# Patient Record
Sex: Male | Born: 1985 | Race: Black or African American | Hispanic: No | Marital: Single | Smoking: Never smoker
Health system: Southern US, Community
[De-identification: ages and names within clinical notes are randomized; demographics above are authoritative.]

## PROBLEM LIST (undated history)

## (undated) DIAGNOSIS — I10 Essential (primary) hypertension: Secondary | ICD-10-CM

## (undated) DIAGNOSIS — J45909 Unspecified asthma, uncomplicated: Secondary | ICD-10-CM

## (undated) DIAGNOSIS — G4733 Obstructive sleep apnea (adult) (pediatric): Secondary | ICD-10-CM

## (undated) DIAGNOSIS — Z9289 Personal history of other medical treatment: Secondary | ICD-10-CM

## (undated) DIAGNOSIS — Z9109 Other allergy status, other than to drugs and biological substances: Secondary | ICD-10-CM

## (undated) HISTORY — DX: Personal history of other medical treatment: Z92.89

## (undated) HISTORY — DX: Obstructive sleep apnea (adult) (pediatric): G47.33

## (undated) HISTORY — DX: Other allergy status, other than to drugs and biological substances: Z91.09

---
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
## 2006-06-28 ENCOUNTER — Emergency Department (HOSPITAL_COMMUNITY): Admission: EM | Admit: 2006-06-28 | Discharge: 2006-06-28 | Payer: Self-pay | Admitting: Emergency Medicine

## 2006-07-02 ENCOUNTER — Emergency Department (HOSPITAL_COMMUNITY): Admission: EM | Admit: 2006-07-02 | Discharge: 2006-07-02 | Payer: Self-pay | Admitting: Family Medicine

## 2006-07-03 ENCOUNTER — Emergency Department (HOSPITAL_COMMUNITY): Admission: EM | Admit: 2006-07-03 | Discharge: 2006-07-03 | Payer: Self-pay | Admitting: Emergency Medicine

## 2006-09-26 ENCOUNTER — Emergency Department (HOSPITAL_COMMUNITY): Admission: EM | Admit: 2006-09-26 | Discharge: 2006-09-26 | Payer: Self-pay | Admitting: Emergency Medicine

## 2006-10-10 ENCOUNTER — Emergency Department (HOSPITAL_COMMUNITY): Admission: EM | Admit: 2006-10-10 | Discharge: 2006-10-11 | Payer: Self-pay | Admitting: Emergency Medicine

## 2007-01-08 ENCOUNTER — Emergency Department (HOSPITAL_COMMUNITY): Admission: EM | Admit: 2007-01-08 | Discharge: 2007-01-08 | Payer: Self-pay | Admitting: Emergency Medicine

## 2007-07-19 ENCOUNTER — Emergency Department (HOSPITAL_COMMUNITY): Admission: EM | Admit: 2007-07-19 | Discharge: 2007-07-20 | Payer: Self-pay | Admitting: Emergency Medicine

## 2008-02-11 ENCOUNTER — Emergency Department (HOSPITAL_COMMUNITY): Admission: EM | Admit: 2008-02-11 | Discharge: 2008-02-11 | Payer: Self-pay | Admitting: Emergency Medicine

## 2008-02-28 ENCOUNTER — Emergency Department (HOSPITAL_COMMUNITY): Admission: EM | Admit: 2008-02-28 | Discharge: 2008-02-28 | Payer: Self-pay | Admitting: Emergency Medicine

## 2008-04-16 ENCOUNTER — Emergency Department (HOSPITAL_COMMUNITY): Admission: EM | Admit: 2008-04-16 | Discharge: 2008-04-16 | Payer: Self-pay | Admitting: Emergency Medicine

## 2008-07-10 ENCOUNTER — Emergency Department (HOSPITAL_COMMUNITY): Admission: EM | Admit: 2008-07-10 | Discharge: 2008-07-10 | Payer: Self-pay | Admitting: Emergency Medicine

## 2008-07-25 ENCOUNTER — Emergency Department (HOSPITAL_COMMUNITY): Admission: EM | Admit: 2008-07-25 | Discharge: 2008-07-25 | Payer: Self-pay | Admitting: Emergency Medicine

## 2008-07-27 ENCOUNTER — Emergency Department (HOSPITAL_COMMUNITY): Admission: EM | Admit: 2008-07-27 | Discharge: 2008-07-27 | Payer: Self-pay | Admitting: Emergency Medicine

## 2008-10-03 ENCOUNTER — Emergency Department (HOSPITAL_COMMUNITY): Admission: EM | Admit: 2008-10-03 | Discharge: 2008-10-04 | Payer: Self-pay | Admitting: Emergency Medicine

## 2008-12-11 ENCOUNTER — Emergency Department (HOSPITAL_COMMUNITY): Admission: EM | Admit: 2008-12-11 | Discharge: 2008-12-11 | Payer: Self-pay | Admitting: Emergency Medicine

## 2009-01-08 ENCOUNTER — Emergency Department (HOSPITAL_COMMUNITY): Admission: EM | Admit: 2009-01-08 | Discharge: 2009-01-08 | Payer: Self-pay | Admitting: Emergency Medicine

## 2009-02-03 ENCOUNTER — Emergency Department (HOSPITAL_COMMUNITY): Admission: EM | Admit: 2009-02-03 | Discharge: 2009-02-03 | Payer: Self-pay | Admitting: Emergency Medicine

## 2009-04-04 ENCOUNTER — Emergency Department (HOSPITAL_COMMUNITY): Admission: EM | Admit: 2009-04-04 | Discharge: 2009-04-04 | Payer: Self-pay | Admitting: Emergency Medicine

## 2009-05-10 ENCOUNTER — Emergency Department (HOSPITAL_COMMUNITY): Admission: EM | Admit: 2009-05-10 | Discharge: 2009-05-10 | Payer: Self-pay | Admitting: Emergency Medicine

## 2011-02-07 NOTE — Op Note (Signed)
NAME:  Christopher Luna, Christopher Luna NO.:  [id number]   MEDICAL RECORD NO.:  [id number]                   PATIENT TYPE:  AMB   LOCATION:  DSC                                  FACILITY:  [facility]   PHYSICIAN:  Lubertha Basque. Jerl Santos, M.D.             DATE OF BIRTH:  04/28/1986   DATE OF PROCEDURE:  11/28/2003  DATE OF DISCHARGE:                                 OPERATIVE REPORT   PREOPERATIVE DIAGNOSIS:  Right knee anterior cruciate ligament tear.   POSTOPERATIVE DIAGNOSES:  1. Right knee anterior cruciate ligament tear.  2. Right knee chondromalacia of the patella.   PROCEDURES:  1. Right knee anterior cruciate ligament reconstruction.  2. Right knee chondroplasty of the patella.   ANESTHESIA:  General.   ATTENDING SURGEON:  Lubertha Basque. Jerl Santos, M.D.   ASSISTANT:  Lindwood Qua, P.A.   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  Unfortunately, he never really regained his extension.  I performed an  arthroscopy on him about two months ago and removed his initial ACL graft  along with a displaced medial meniscus tear.  He has gone through therapy  and has regained his extension.  He is now scheduled for a repeat ACL  reconstruction.  Informed operative consent was obtained after discussion of  the possible complications of, reaction to anesthesia, infection, and  obviously knee stiffness.  The requirement for some significant  postoperative therapy was reviewed with the patient.  We went into a lengthy  discussion about choice of graft material and together decided upon an  allograft.   DESCRIPTION OF PROCEDURE:  The patient was taken to the operating suite,  where a general anesthetic was applied without difficulty.  He was  positioned supine and prepped and draped in normal sterile fashion.  After  the administration of preoperative IV antibiotics, an arthroscopy of  the  right knee was done through two of his old inferior portals.  The  suprapatellar pouch was benign while the patellofemoral joint exhibited some  grade 2 chondromalacia, addressed with a chondroplasty.  The medial  compartment showed him to be without a portion of his medial meniscus, which  we removed at the last surgery, but no new tears were identified.  There  were no degenerative changes.  In the lateral compartment he had benign-  appearing meniscus and articular cartilage.  His PCL was intact.  The ACL  was no longer present.  A notchplasty was done with a bur creating some  space for a new ACL.  The posterior aspect of the notch was well-visualized.  On a back table an allograft was fully defrosted and prepared by Lindwood Qua, P.A.  This was trimmed to fit through 9 and 10 mm tunnels.  Drill  holes were placed in each of the bone  plugs of this graft with patellar  tendon in between.  We passed a PDS suture through one of the bone plugs and  a wire through the other.  A guide was placed into the knee just anterior to  the PCL and was utilized to pass a guidewire up through an anterior stab  wound in the tibia into the knee.  This was over-reamed to a diameter of 10  mm.  A second guide was placed through this tunnel in the over-the-top  position of the femoral notch.  A second guidewire was placed through this  guide and out the distal femur and out the proximal thigh.  This was  utilized to over-ream the distal femur to a diameter of 9 mm and a depth of  about 3 cm.  A 1 or 2 mm posterior wall was well-visualized.  A shaver was  placed in the knee and was utilized to remove bony and cartilaginous debris.  The aforementioned fully-defrosted tension graft was then pulled through the  tibial tunnel and into the femoral tunnel.  Care was taken to keep the  tendinous surface in a posterior direction as it entered the femoral tunnel.  A guidewire was placed through the medial  arthroscopic portal in an anterior  position in the femoral tunnel.  An 8 x 25 mm Linvatec screw was then placed  over this guidewire securing the leading bone plug in the femoral tunnel.  The knee was then ranged fully to full hyperextension and the graft was felt  to be fairly isometric with no impingement in the notch.  A second guidewire  was placed into the knee and was seen to enter the knee through the tibial  tunnel arthroscopically.  We placed an identical interference screw of this  guidewire to secure the trailing bone plug in the tibial tunnel.  Again the  knee ranged fully with no impingement.  The knee was thoroughly irrigated,  followed by the placement of Marcaine with epinephrine and morphine.  Simple  sutures of nylon were used to loosely reapproximate the anterior stab wound.  Adaptic was placed over the wounds, followed by dry gauze and a loose Ace  wrap.  Estimated blood loss and intraoperative fluids can be obtained from  anesthesia records.  No tourniquet was placed.   DISPOSITION:  The patient was extubated in the operating room and taken to  the recovery room in stable condition.  Plans were for him to be admitted  for overnight observation for pain control with probable discharge home in  the morning.                                               Lubertha Basque Jerl Santos, M.D.    PGD/MEDQ  D:  11/28/2003  T:  11/28/2003  Job:  [id number]

## 2011-06-18 LAB — DIFFERENTIAL
Eosinophils Absolute: 0.5
Eosinophils Relative: 9 — ABNORMAL HIGH
Lymphocytes Relative: 28
Lymphs Abs: 1.6
Monocytes Absolute: 0.8

## 2011-06-18 LAB — POCT I-STAT, CHEM 8
BUN: 9
Calcium, Ion: 1.15
Creatinine, Ser: 1.4
Glucose, Bld: 88
Hemoglobin: 16
TCO2: 28

## 2011-06-18 LAB — CBC
HCT: 44.2
Hemoglobin: 15.6
MCV: 86.3
Platelets: 216
WBC: 5.9

## 2015-12-12 ENCOUNTER — Encounter (HOSPITAL_COMMUNITY): Payer: Self-pay | Admitting: *Deleted

## 2015-12-12 DIAGNOSIS — R531 Weakness: Secondary | ICD-10-CM | POA: Diagnosis not present

## 2015-12-12 DIAGNOSIS — J45909 Unspecified asthma, uncomplicated: Secondary | ICD-10-CM | POA: Insufficient documentation

## 2015-12-12 DIAGNOSIS — A084 Viral intestinal infection, unspecified: Secondary | ICD-10-CM | POA: Insufficient documentation

## 2015-12-12 DIAGNOSIS — R1110 Vomiting, unspecified: Secondary | ICD-10-CM | POA: Diagnosis present

## 2015-12-12 LAB — CBC
HEMATOCRIT: 44.9 % (ref 39.0–52.0)
HEMOGLOBIN: 15.3 g/dL (ref 13.0–17.0)
MCH: 28.6 pg (ref 26.0–34.0)
MCHC: 34.1 g/dL (ref 30.0–36.0)
MCV: 83.9 fL (ref 78.0–100.0)
Platelets: 229 10*3/uL (ref 150–400)
RBC: 5.35 MIL/uL (ref 4.22–5.81)
RDW: 12.9 % (ref 11.5–15.5)
WBC: 7.2 10*3/uL (ref 4.0–10.5)

## 2015-12-12 LAB — COMPREHENSIVE METABOLIC PANEL
ALBUMIN: 3.9 g/dL (ref 3.5–5.0)
ALK PHOS: 50 U/L (ref 38–126)
ALT: 32 U/L (ref 17–63)
AST: 33 U/L (ref 15–41)
Anion gap: 10 (ref 5–15)
BILIRUBIN TOTAL: 2 mg/dL — AB (ref 0.3–1.2)
BUN: 10 mg/dL (ref 6–20)
CO2: 25 mmol/L (ref 22–32)
CREATININE: 1.32 mg/dL — AB (ref 0.61–1.24)
Calcium: 8.9 mg/dL (ref 8.9–10.3)
Chloride: 102 mmol/L (ref 101–111)
GFR calc Af Amer: >60 mL/min (ref >60)
GFR calc non Af Amer: >60 mL/min (ref >60)
GLUCOSE: 103 mg/dL — AB (ref 65–99)
Potassium: 3.6 mmol/L (ref 3.5–5.1)
SODIUM: 137 mmol/L (ref 135–145)
Total Protein: 7.3 g/dL (ref 6.5–8.1)

## 2015-12-12 LAB — URINALYSIS, ROUTINE W REFLEX MICROSCOPIC
Bilirubin Urine: NEGATIVE
GLUCOSE, UA: NEGATIVE mg/dL
Ketones, ur: 15 mg/dL — AB
LEUKOCYTES UA: NEGATIVE
Nitrite: NEGATIVE
Protein, ur: NEGATIVE mg/dL
SPECIFIC GRAVITY, URINE: 1.029 (ref 1.005–1.030)
pH: 6 (ref 5.0–8.0)

## 2015-12-12 LAB — URINE MICROSCOPIC-ADD ON

## 2015-12-12 LAB — LIPASE, BLOOD: Lipase: 21 U/L (ref 11–51)

## 2015-12-12 NOTE — ED Notes (Signed)
Pt c/o emesis since yesterday, once today. States he feels weak.

## 2015-12-13 ENCOUNTER — Emergency Department (HOSPITAL_COMMUNITY)
Admission: EM | Admit: 2015-12-13 | Discharge: 2015-12-13 | Disposition: A | Discharge status: Home / Self Care | Payer: Medicaid Other | Attending: Emergency Medicine | Admitting: Emergency Medicine

## 2015-12-13 DIAGNOSIS — A084 Viral intestinal infection, unspecified: Secondary | ICD-10-CM

## 2015-12-13 HISTORY — DX: Unspecified asthma, uncomplicated: J45.909

## 2015-12-13 MED ORDER — SODIUM CHLORIDE 0.9 % IV BOLUS (SEPSIS)
500.0000 mL | Freq: Once | INTRAVENOUS | Status: AC
  Administered 2015-12-13: 500 mL via INTRAVENOUS

## 2015-12-13 MED ORDER — KETOROLAC TROMETHAMINE 30 MG/ML IJ SOLN
30.0000 mg | Freq: Once | INTRAMUSCULAR | Status: AC
  Administered 2015-12-13: 30 mg via INTRAVENOUS
  Filled 2015-12-13: qty 1

## 2015-12-13 MED ORDER — DICYCLOMINE HCL 10 MG/ML IM SOLN
20.0000 mg | Freq: Once | INTRAMUSCULAR | Status: AC
  Administered 2015-12-13: 20 mg via INTRAMUSCULAR
  Filled 2015-12-13: qty 2

## 2015-12-13 MED ORDER — ONDANSETRON HCL 4 MG/2ML IJ SOLN
4.0000 mg | Freq: Once | INTRAMUSCULAR | Status: AC
  Administered 2015-12-13: 4 mg via INTRAVENOUS
  Filled 2015-12-13: qty 2

## 2015-12-13 MED ORDER — PROMETHAZINE HCL 25 MG PO TABS: 25.0000 mg | ORAL_TABLET | Freq: Four times a day (QID) | ORAL | Status: DC | PRN

## 2015-12-13 MED ORDER — SODIUM CHLORIDE 0.9 % IV BOLUS (SEPSIS)
1000.0000 mL | Freq: Once | INTRAVENOUS | Status: AC
  Administered 2015-12-13: 1000 mL via INTRAVENOUS

## 2015-12-13 MED ORDER — DICYCLOMINE HCL 20 MG PO TABS: 20.0000 mg | ORAL_TABLET | Freq: Two times a day (BID) | ORAL | Status: DC

## 2015-12-13 NOTE — ED Provider Notes (Signed)
CSN: [id number]     Arrival date & time 12/12/15  2125 History   First MD Initiated Contact with Patient 12/13/15 0202     Chief Complaint  Patient presents with  . Emesis  . Weakness     (Consider location/radiation/quality/duration/timing/severity/associated sxs/prior Treatment) HPI Comments: 30 year old male with a history of asthma presents to the emergency department for evaluation of vomiting which began yesterday. Patient reports persistent emesis over the past 48 hours. Emesis is intermittent, last episode was while patient was waiting in the waiting room. Symptoms associated with watery diarrhea as well. Patient states that he has been feeling weak and fatigued. He reports associated sick contacts, stating that his brother has been sick with vomiting and his daughter is also ill. No medications taken prior to arrival for symptoms. Patient denies fever, abdominal pain, urinary symptoms, and a history of abdominal surgeries.  Patient is a 30 y.o. male presenting with vomiting and weakness. The history is provided by the patient. No language interpreter was used.  Emesis Associated symptoms: diarrhea   Associated symptoms: no abdominal pain   Weakness Associated symptoms include vomiting and weakness. Pertinent negatives include no abdominal pain or fever.    Past Medical History  Diagnosis Date  . Asthma    Past Surgical History  Procedure Laterality Date  . Arthroscopic repair acl     No family history on file. Social History  Substance Use Topics  . Smoking status: Never Smoker   . Smokeless tobacco: None  . Alcohol Use: No    Review of Systems  Constitutional: Negative for fever.  Respiratory: Negative for shortness of breath.   Gastrointestinal: Positive for vomiting and diarrhea. Negative for abdominal pain.  Neurological: Positive for weakness.  All other systems reviewed and are negative.   Allergies  Review of patient's allergies indicates no known  allergies.  Home Medications   Prior to Admission medications   Medication Sig Start Date End Date Taking? Authorizing Provider  dicyclomine (BENTYL) 20 MG tablet Take 1 tablet (20 mg total) by mouth 2 (two) times daily. 12/13/15   Antony Madura, PA-C  promethazine (PHENERGAN) 25 MG tablet Take 1 tablet (25 mg total) by mouth every 6 (six) hours as needed for nausea or vomiting. 12/13/15   Antony Madura, PA-C   BP 103/54 mmHg  Pulse 93  Temp(Src) 98.8 F (37.1 C) (Oral)  Resp 18  SpO2 94%   Physical Exam  Constitutional: He is oriented to person, place, and time. He appears well-developed and well-nourished. No distress.  Nontoxic appearing  HENT:  Head: Normocephalic and atraumatic.  Dry mm  Eyes: Conjunctivae and EOM are normal. No scleral icterus.  Neck: Normal range of motion.  Cardiovascular: Normal rate, regular rhythm and intact distal pulses.   Pulmonary/Chest: Effort normal and breath sounds normal. No respiratory distress. He has no wheezes. He has no rales.  Respirations even and unlabored  Abdominal: Soft. He exhibits no distension. There is tenderness. There is no rebound and no guarding.  Mild focal LLQ pain. No masses or peritoneal signs. No rigidity or guarding.  Musculoskeletal: Normal range of motion.  Neurological: He is alert and oriented to person, place, and time. He exhibits normal muscle tone. Coordination normal.  Patient moving all extremities  Skin: Skin is warm and dry. No rash noted. He is not diaphoretic. No erythema. No pallor.  Psychiatric: He has a normal mood and affect. His behavior is normal.  Nursing note and vitals reviewed.   ED Course  Procedures (including critical care time) Labs Review Labs Reviewed  COMPREHENSIVE METABOLIC PANEL - Abnormal; Notable for the following:    Glucose, Bld 103 (*)    Creatinine, Ser 1.32 (*)    Total Bilirubin 2.0 (*)    All other components within normal limits  URINALYSIS, ROUTINE W REFLEX MICROSCOPIC  (NOT AT [facility]) - Abnormal; Notable for the following:    Color, Urine AMBER (*)    Hgb urine dipstick TRACE (*)    Ketones, ur 15 (*)    All other components within normal limits  URINE MICROSCOPIC-ADD ON - Abnormal; Notable for the following:    Squamous Epithelial / LPF 0-5 (*)    Bacteria, UA RARE (*)    All other components within normal limits  LIPASE, BLOOD  CBC    Imaging Review No results found.   I have personally reviewed and evaluated these images and lab results as part of my medical decision-making.   EKG Interpretation None      3:50 AM Patient reexamined. No abdominal TTP noted. Patient sleeping, in no distress. He continues to quickly take in IVF. Plan d/c after additional 500cc bolus.  4:55 AM Patient finished 1.5L IVF. He is tolerating Sprite without emesis. Vital stable. Will discharge with supportive measures. MDM   Final diagnoses:  Viral gastroenteritis    Patient with symptoms consistent with viral gastroenteritis. Vitals are stable, no fever. Lungs are clear. Reassuring abdominal exam today; stable on reexamination. No leukocytosis. Doubt appendicitis, cholecystitis, pancreatitis, ruptured viscus, UTI, kidney stone, or other emergent abdominal etiology. No clinical signs of dehydration. Patient tolerating PO fluids in the ED. Supportive therapy indicated with return if symptoms worsen. Return precautions discussed and provided. Patient discharged in satisfactory condition with no unaddressed concerns.   Filed Vitals:   12/12/15 2200 12/13/15 0058 12/13/15 0300  BP: 159/88 127/73 103/54  Pulse: 104 90 93  Temp: 98.6 F (37 C) 98.8 F (37.1 C)   TempSrc: Oral    Resp: 18 18 18   SpO2: 98% 97% 94%     Antony MaduraKelly Octavious Zidek, PA-C 12/13/15 0500  Marily MemosJason Mesner, MD 12/13/15 [phone]

## 2015-12-13 NOTE — Discharge Instructions (Signed)

## 2015-12-13 NOTE — ED Notes (Signed)
Pt given sprite for PO challenge

## 2015-12-15 ENCOUNTER — Encounter (HOSPITAL_COMMUNITY): Payer: Self-pay | Admitting: Nurse Practitioner

## 2015-12-15 ENCOUNTER — Emergency Department (HOSPITAL_COMMUNITY)
Admission: EM | Admit: 2015-12-15 | Discharge: 2015-12-15 | Disposition: A | Discharge status: Home / Self Care | Payer: Medicaid Other | Attending: Emergency Medicine | Admitting: Emergency Medicine

## 2015-12-15 ENCOUNTER — Emergency Department (HOSPITAL_COMMUNITY): Payer: Medicaid Other

## 2015-12-15 DIAGNOSIS — J45909 Unspecified asthma, uncomplicated: Secondary | ICD-10-CM | POA: Insufficient documentation

## 2015-12-15 DIAGNOSIS — R109 Unspecified abdominal pain: Secondary | ICD-10-CM

## 2015-12-15 DIAGNOSIS — M549 Dorsalgia, unspecified: Secondary | ICD-10-CM | POA: Diagnosis not present

## 2015-12-15 DIAGNOSIS — Z79899 Other long term (current) drug therapy: Secondary | ICD-10-CM | POA: Diagnosis not present

## 2015-12-15 DIAGNOSIS — Z8619 Personal history of other infectious and parasitic diseases: Secondary | ICD-10-CM | POA: Insufficient documentation

## 2015-12-15 LAB — CBC
HEMATOCRIT: 44.7 % (ref 39.0–52.0)
HEMOGLOBIN: 15.4 g/dL (ref 13.0–17.0)
MCH: 28.6 pg (ref 26.0–34.0)
MCHC: 34.5 g/dL (ref 30.0–36.0)
MCV: 83.1 fL (ref 78.0–100.0)
Platelets: 255 10*3/uL (ref 150–400)
RBC: 5.38 MIL/uL (ref 4.22–5.81)
RDW: 12.6 % (ref 11.5–15.5)
WBC: 6.8 10*3/uL (ref 4.0–10.5)

## 2015-12-15 LAB — COMPREHENSIVE METABOLIC PANEL
ALK PHOS: 53 U/L (ref 38–126)
ALT: 31 U/L (ref 17–63)
AST: 29 U/L (ref 15–41)
Albumin: 4.1 g/dL (ref 3.5–5.0)
Anion gap: 8 (ref 5–15)
BILIRUBIN TOTAL: 1.3 mg/dL — AB (ref 0.3–1.2)
BUN: 11 mg/dL (ref 6–20)
CHLORIDE: 103 mmol/L (ref 101–111)
CO2: 31 mmol/L (ref 22–32)
Calcium: 9.2 mg/dL (ref 8.9–10.3)
Creatinine, Ser: 1.14 mg/dL (ref 0.61–1.24)
GFR calc Af Amer: >60 mL/min (ref >60)
GFR calc non Af Amer: >60 mL/min (ref >60)
GLUCOSE: 77 mg/dL (ref 65–99)
POTASSIUM: 3.9 mmol/L (ref 3.5–5.1)
SODIUM: 142 mmol/L (ref 135–145)
TOTAL PROTEIN: 7.1 g/dL (ref 6.5–8.1)

## 2015-12-15 LAB — URINALYSIS, ROUTINE W REFLEX MICROSCOPIC
Bilirubin Urine: NEGATIVE
GLUCOSE, UA: NEGATIVE mg/dL
Hgb urine dipstick: NEGATIVE
Ketones, ur: NEGATIVE mg/dL
LEUKOCYTES UA: NEGATIVE
NITRITE: NEGATIVE
PH: 6 (ref 5.0–8.0)
Protein, ur: 30 mg/dL — AB
SPECIFIC GRAVITY, URINE: 1.024 (ref 1.005–1.030)

## 2015-12-15 LAB — URINE MICROSCOPIC-ADD ON
Bacteria, UA: NONE SEEN
RBC / HPF: NONE SEEN RBC/hpf (ref 0–5)
WBC UA: NONE SEEN WBC/hpf (ref 0–5)

## 2015-12-15 NOTE — ED Notes (Signed)
He c/o 1 week history  L lower back/flank pain increasingly worse since onset. He repots some episodes n/v. He was here 2 days ago and diagnosed with viral gastroenteritis and he initially felt better but pain continues. He denies bowel/bladder chagnes.

## 2015-12-15 NOTE — ED Notes (Signed)
Patient transported to Ultrasound 

## 2015-12-15 NOTE — Discharge Instructions (Signed)
There does not appear to be an emergent cause for your symptoms at this time. Your exam was reassuring, as were your labs. Your ultrasound showed no evidence of stone or other things concerning for infection. Continue taking Tylenol and Motrin for your discomfort. Follow-up with your doctor next week for reevaluation. Return to ED for any new or worsening symptoms as we discussed

## 2015-12-15 NOTE — ED Provider Notes (Signed)
CSN: [id number]     Arrival date & time 12/15/15  1217 History   First MD Initiated Contact with Patient 12/15/15 1350     Chief Complaint  Patient presents with  . Flank Pain     (Consider location/radiation/quality/duration/timing/severity/associated sxs/prior Treatment) HPI Christopher Luna is a 30 y.o. male with a history of well-controlled asthma, comes in for evaluation of left-sided flank and back pain. Patient reports symptoms have been ongoing for the past 2 weeks. He reports originally the pain was intermittent and mild, but over the past 1 week it has become constant, sharp and worse with movement. He reports associated nausea and vomiting times one episode, 2 days ago that has since resolved. Denies any fevers, chills, abdominal pain, urinary symptoms, diarrhea or constipation, rash, sick contacts, cough, shortness of breath, chest pain, numbness or weakness. Reports he was seen in the ED 2 days ago and diagnosed with a viral gastroenteritis and initially felt better after fluids, but symptoms have started to worsen.  Past Medical History  Diagnosis Date  . Asthma    Past Surgical History  Procedure Laterality Date  . Arthroscopic repair acl     History reviewed. No pertinent family history. Social History  Substance Use Topics  . Smoking status: Never Smoker   . Smokeless tobacco: None  . Alcohol Use: No    Review of Systems A 10 point review of systems was completed and was negative except for pertinent positives and negatives as mentioned in the history of present illness     Allergies  Review of patient's allergies indicates no known allergies.  Home Medications   Prior to Admission medications   Medication Sig Start Date End Date Taking? Authorizing Provider  dicyclomine (BENTYL) 20 MG tablet Take 1 tablet (20 mg total) by mouth 2 (two) times daily. 12/13/15   Antony Madura, PA-C  promethazine (PHENERGAN) 25 MG tablet Take 1 tablet (25 mg total) by mouth every 6  (six) hours as needed for nausea or vomiting. 12/13/15   Antony Madura, PA-C   BP 141/89 mmHg  Pulse 74  Temp(Src) 97.8 F (36.6 C) (Oral)  Resp 16  Ht  (1.93 m)  Wt 156.037 kg  BMI 41.89 kg/m2  SpO2 100% Physical Exam  Constitutional: He is oriented to person, place, and time. He appears well-developed and well-nourished.  Overall well-appearing African American male  HENT:  Head: Normocephalic and atraumatic.  Mouth/Throat: Oropharynx is clear and moist.  Eyes: Conjunctivae are normal. Pupils are equal, round, and reactive to light. Right eye exhibits no discharge. Left eye exhibits no discharge. No scleral icterus.  Neck: Neck supple.  Cardiovascular: Normal rate, regular rhythm and normal heart sounds.   Pulmonary/Chest: Effort normal and breath sounds normal. No respiratory distress. He has no wheezes. He has no rales.  Abdominal: Soft. He exhibits no distension and no mass. There is no tenderness. There is no rebound and no guarding.  Musculoskeletal:  Discomfort is replicated with palpation of the left lateral ribs and intercostal spaces along axillary and posterior axillary line. No rash noted. No crepitus, tenting or other abnormalities. Full active range of motion of all extremities. No other abnormal findings  Neurological: He is alert and oriented to person, place, and time.  Cranial Nerves II-XII grossly intact  Skin: Skin is warm and dry. No rash noted.  Psychiatric: He has a normal mood and affect.  Nursing note and vitals reviewed.   ED Course  Procedures (including critical care time) Labs  Review Labs Reviewed  COMPREHENSIVE METABOLIC PANEL - Abnormal; Notable for the following:    Total Bilirubin 1.3 (*)    All other components within normal limits  URINALYSIS, ROUTINE W REFLEX MICROSCOPIC (NOT AT [facility]) - Abnormal; Notable for the following:    APPearance HAZY (*)    Protein, ur 30 (*)    All other components within normal limits  URINE MICROSCOPIC-ADD ON  - Abnormal; Notable for the following:    Squamous Epithelial / LPF 0-5 (*)    All other components within normal limits  CBC    Imaging Review [place] Renal  12/15/2015  CLINICAL DATA:  Left flank pain for 2 weeks. EXAM: RENAL / URINARY TRACT ULTRASOUND COMPLETE COMPARISON:  None. FINDINGS: Right Kidney: Length: 11.3 cm. Echogenicity within normal limits. No mass or hydronephrosis visualized. Left Kidney: Length: 12.0 cm. Echogenicity within normal limits. No mass or hydronephrosis visualized. Bladder: Appears normal for degree of bladder distention. IMPRESSION: Normal renal ultrasound. Electronically Signed   By: Amie Portlandavid  Ormond M.D.   On: 12/15/2015 15:13   I have personally reviewed and evaluated these images and lab results as part of my medical decision-making.   EKG Interpretation None     Meds given in ED:  Medications - No data to display  Discharge Medication List as of 12/15/2015  3:22 PM     Filed Vitals:   12/15/15 1259 12/15/15 1526  BP: 143/90 141/89  Pulse: 90 74  Temp: 97.6 F (36.4 C) 97.8 F (36.6 C)  TempSrc: Oral Oral  Resp: 20 16  Height: 6\' 4"  (1.93 m)   Weight: 156.037 kg   SpO2: 98% 100%    MDM  Christopher Luna is a 30 y.o. male who presents for evaluation of left flank pain over the past 2 weeks. She'll evaluated in the ED 2 days ago, diagnosed with viral gastroenteritis with overall unremarkable workup. Patient reports pain is persistent and constant now. Discomfort is replicated with palpation and certain body movements. However, given chronicity of discomfort, trace hemoglobin on previous urinalysis, will obtain repeat labs as well as a renal ultrasound to evaluate for possible stone. On reevaluation, the patient maintains benign physical exam. Labs are unremarkable, no hematuria or other evidence of infection. Ultrasound unremarkable. At this time I have low suspicion for any acute or emergent pathology. Encourage continued symptomatic support at home and  follow up with PCP next week for reevaluation. States he feels much better. Discussed strict return precautions. He verbalizes understanding and agrees with this plan as well as subsequent discharge. Final diagnoses:  Left flank pain        Joycie PeekBenjamin Geza Beranek, PA-C 12/15/15 1626  Azalia BilisKevin Campos, MD 12/15/15 1721

## 2016-01-17 ENCOUNTER — Encounter (HOSPITAL_COMMUNITY): Payer: Self-pay | Admitting: *Deleted

## 2016-01-17 ENCOUNTER — Emergency Department (HOSPITAL_COMMUNITY): Payer: Medicaid Other

## 2016-01-17 DIAGNOSIS — J4521 Mild intermittent asthma with (acute) exacerbation: Secondary | ICD-10-CM | POA: Insufficient documentation

## 2016-01-17 DIAGNOSIS — H1013 Acute atopic conjunctivitis, bilateral: Secondary | ICD-10-CM | POA: Insufficient documentation

## 2016-01-17 DIAGNOSIS — R062 Wheezing: Secondary | ICD-10-CM | POA: Diagnosis present

## 2016-01-17 DIAGNOSIS — Z79899 Other long term (current) drug therapy: Secondary | ICD-10-CM | POA: Insufficient documentation

## 2016-01-17 MED ORDER — IPRATROPIUM-ALBUTEROL 0.5-2.5 (3) MG/3ML IN SOLN
3.0000 mL | Freq: Once | RESPIRATORY_TRACT | Status: AC
  Administered 2016-01-17: 3 mL via RESPIRATORY_TRACT

## 2016-01-17 MED ORDER — ALBUTEROL SULFATE (2.5 MG/3ML) 0.083% IN NEBU
INHALATION_SOLUTION | RESPIRATORY_TRACT | Status: AC
  Filled 2016-01-17: qty 3

## 2016-01-17 NOTE — ED Notes (Signed)
Asthma exacerbation, increasing over one week.  Some tightness and sob. No fever or chill, pt was using inhaler at home obtaining only minimal relief

## 2016-01-18 ENCOUNTER — Emergency Department (HOSPITAL_COMMUNITY)
Admission: EM | Admit: 2016-01-18 | Discharge: 2016-01-18 | Disposition: A | Discharge status: Home / Self Care | Payer: Medicaid Other | Attending: Emergency Medicine | Admitting: Emergency Medicine

## 2016-01-18 DIAGNOSIS — J4520 Mild intermittent asthma, uncomplicated: Secondary | ICD-10-CM

## 2016-01-18 MED ORDER — IPRATROPIUM BROMIDE HFA 17 MCG/ACT IN AERS: 2.0000 | INHALATION_SPRAY | Freq: Once | RESPIRATORY_TRACT | Status: DC

## 2016-01-18 MED ORDER — IPRATROPIUM BROMIDE 0.02 % IN SOLN
0.5000 mg | Freq: Once | RESPIRATORY_TRACT | Status: AC
  Administered 2016-01-18: 0.5 mg via RESPIRATORY_TRACT
  Filled 2016-01-18: qty 2.5

## 2016-01-18 MED ORDER — PREDNISONE 20 MG PO TABS: 60.0000 mg | ORAL_TABLET | Freq: Every day | ORAL | Status: DC

## 2016-01-18 MED ORDER — PREDNISONE 20 MG PO TABS
60.0000 mg | ORAL_TABLET | Freq: Once | ORAL | Status: AC
  Administered 2016-01-18: 60 mg via ORAL
  Filled 2016-01-18: qty 3

## 2016-01-18 MED ORDER — ALBUTEROL SULFATE (2.5 MG/3ML) 0.083% IN NEBU
5.0000 mg | INHALATION_SOLUTION | Freq: Once | RESPIRATORY_TRACT | Status: AC
  Administered 2016-01-18: 5 mg via RESPIRATORY_TRACT
  Filled 2016-01-18: qty 6

## 2016-01-18 MED ORDER — OLOPATADINE HCL 0.2 % OP SOLN: 1.0000 [drp] | Freq: Every day | OPHTHALMIC | Status: DC

## 2016-01-18 MED ORDER — ALBUTEROL SULFATE HFA 108 (90 BASE) MCG/ACT IN AERS
2.0000 | INHALATION_SPRAY | RESPIRATORY_TRACT | Status: DC | PRN
  Administered 2016-01-18: 2 via RESPIRATORY_TRACT
  Filled 2016-01-18: qty 6.7

## 2016-01-18 NOTE — ED Provider Notes (Signed)
CSN: [id number]     Arrival date & time 01/17/16  2253 History   First MD Initiated Contact with Patient 01/18/16 0448     Chief Complaint  Patient presents with  . Asthma     (Consider location/radiation/quality/duration/timing/severity/associated sxs/prior Treatment) HPI Comments: Patient with a history of asthma presents with increased wheezing over the last week. He reports he has used more than one full inhaler. He does not use nebulized medication at home. No fever. He states he has had remote history of hospital admissions without known history of intubation. Chest tightness without chest pain. No vomiting or diarrhea.   Patient is a 30 y.o. male presenting with asthma. The history is provided by the patient. No language interpreter was used.  Asthma This is a recurrent problem. The current episode started 1 to 4 weeks ago. The problem has been gradually worsening. Pertinent negatives include no chest pain, congestion, fever, myalgias, rash, sore throat or vomiting.    Past Medical History  Diagnosis Date  . Asthma    Past Surgical History  Procedure Laterality Date  . Arthroscopic repair acl     No family history on file. Social History  Substance Use Topics  . Smoking status: Never Smoker   . Smokeless tobacco: None  . Alcohol Use: No    Review of Systems  Constitutional: Negative for fever.  HENT: Negative for congestion and sore throat.   Eyes: Positive for redness.  Respiratory: Positive for shortness of breath and wheezing.   Cardiovascular: Negative for chest pain.  Gastrointestinal: Negative for vomiting.  Musculoskeletal: Negative for myalgias and neck stiffness.  Skin: Negative for rash.      Allergies  Review of patient's allergies indicates no known allergies.  Home Medications   Prior to Admission medications   Medication Sig Start Date End Date Taking? Authorizing Provider  dicyclomine (BENTYL) 20 MG tablet Take 1 tablet (20 mg total) by mouth  2 (two) times daily. 12/13/15   Antony Madura, PA-C  promethazine (PHENERGAN) 25 MG tablet Take 1 tablet (25 mg total) by mouth every 6 (six) hours as needed for nausea or vomiting. 12/13/15   Antony Madura, PA-C   BP 148/82 mmHg  Pulse 80  Temp(Src) 97.7 F (36.5 C) (Oral)  Resp 18  Wt 154.223 kg  SpO2 96% Physical Exam  Constitutional: He is oriented to person, place, and time. He appears well-developed and well-nourished.  HENT:  Head: Normocephalic.  Eyes: Pupils are equal, round, and reactive to light. Right conjunctiva is injected. Right conjunctiva has no hemorrhage. Left conjunctiva is injected. Left conjunctiva has no hemorrhage.  Neck: Normal range of motion. Neck supple.  Cardiovascular: Normal rate and regular rhythm.   Pulmonary/Chest: Effort normal. He has wheezes.  Abdominal: Soft. Bowel sounds are normal. There is no tenderness. There is no rebound and no guarding.  Musculoskeletal: Normal range of motion.  Neurological: He is alert and oriented to person, place, and time.  Skin: Skin is warm and dry. No rash noted.  Psychiatric: He has a normal mood and affect.    ED Course  Procedures (including critical care time) Labs Review Labs Reviewed - No data to display  Imaging Review Dg Chest 2 View  01/18/2016  CLINICAL DATA:  Acute onset of chest tightness and shortness of breath. Initial encounter. EXAM: CHEST  2 VIEW COMPARISON:  Chest radiograph performed 10/03/2008 FINDINGS: The lungs are well-aerated and clear. There is no evidence of focal opacification, pleural effusion or pneumothorax. The heart  is normal in size; the mediastinal contour is within normal limits. No acute osseous abnormalities are seen. IMPRESSION: No acute cardiopulmonary process seen. Electronically Signed   By: Roanna RaiderJeffery  Chang M.D.   On: 01/18/2016 00:03   I have personally reviewed and evaluated these images and lab results as part of my medical decision-making.   EKG Interpretation None       MDM   Final diagnoses:  None    1. Asthma 2. Allergic conjunctivitis  The patient was wheezing on arrival to the ED, improved after duo neb tx. No fever, hypoxia. When seen he felt his chest was tightening again and a second treatment was provided. He is ambulated with pulse ox remaining in the upper 90's. Symptoms felt to represent mild asthmatic exacerbation, improved. He can be discharged home with return precautions.     Elpidio AnisShari Monika Chestang, PA-C 01/18/16 0556  April Palumbo, MD 01/18/16 [phone]

## 2016-01-18 NOTE — ED Notes (Signed)
Ambulated patient in hall.  O2 sats 97 - 100% while ambulating.

## 2016-01-18 NOTE — Discharge Instructions (Signed)
Asthma, Adult Asthma is a recurring condition in which the airways tighten and narrow. Asthma can make it difficult to breathe. It can cause coughing, wheezing, and shortness of breath. Asthma episodes, also called asthma attacks, range from minor to life-threatening. Asthma cannot be cured, but medicines and lifestyle changes can help control it. CAUSES Asthma is believed to be caused by inherited (genetic) and environmental factors, but its exact cause is unknown. Asthma may be triggered by allergens, lung infections, or irritants in the air. Asthma triggers are different for each person. Common triggers include:   Animal dander.  Dust mites.  Cockroaches.  Pollen from trees or grass.  Mold.  Smoke.  Air pollutants such as dust, household cleaners, hair sprays, aerosol sprays, paint fumes, strong chemicals, or strong odors.  Cold air, weather changes, and winds (which increase molds and pollens in the air).  Strong emotional expressions such as crying or laughing hard.  Stress.  Certain medicines (such as aspirin) or types of drugs (such as beta-blockers).  Sulfites in foods and drinks. Foods and drinks that may contain sulfites include dried fruit, potato chips, and sparkling grape juice.  Infections or inflammatory conditions such as the flu, a cold, or an inflammation of the nasal membranes (rhinitis).  Gastroesophageal reflux disease (GERD).  Exercise or strenuous activity. SYMPTOMS Symptoms may occur immediately after asthma is triggered or many hours later. Symptoms include:  Wheezing.  Excessive nighttime or early morning coughing.  Frequent or severe coughing with a common cold.  Chest tightness.  Shortness of breath. DIAGNOSIS  The diagnosis of asthma is made by a review of your medical history and a physical exam. Tests may also be performed. These may include:  Lung function studies. These tests show how much air you breathe in and out.  Allergy  tests.  Imaging tests such as X-rays. TREATMENT  Asthma cannot be cured, but it can usually be controlled. Treatment involves identifying and avoiding your asthma triggers. It also involves medicines. There are 2 classes of medicine used for asthma treatment:   Controller medicines. These prevent asthma symptoms from occurring. They are usually taken every day.  Reliever or rescue medicines. These quickly relieve asthma symptoms. They are used as needed and provide short-term relief. Your health care provider will help you create an asthma action plan. An asthma action plan is a written plan for managing and treating your asthma attacks. It includes a list of your asthma triggers and how they may be avoided. It also includes information on when medicines should be taken and when their dosage should be changed. An action plan may also involve the use of a device called a peak flow meter. A peak flow meter measures how well the lungs are working. It helps you monitor your condition. HOME CARE INSTRUCTIONS   Take medicines only as directed by your health care provider. Speak with your health care provider if you have questions about how or when to take the medicines.  Use a peak flow meter as directed by your health care provider. Record and keep track of readings.  Understand and use the action plan to help minimize or stop an asthma attack without needing to seek medical care.  Control your home environment in the following ways to help prevent asthma attacks:  Do not smoke. Avoid being exposed to secondhand smoke.  Change your heating and air conditioning filter regularly.  Limit your use of fireplaces and wood stoves.  Get rid of pests (such as roaches  and mice) and their droppings.  Throw away plants if you see mold on them.  Clean your floors and dust regularly. Use unscented cleaning products.  Try to have someone else vacuum for you regularly. Stay out of rooms while they are  being vacuumed and for a short while afterward. If you vacuum, use a dust mask from a hardware store, a double-layered or microfilter vacuum cleaner bag, or a vacuum cleaner with a HEPA filter.  Replace carpet with wood, tile, or vinyl flooring. Carpet can trap dander and dust.  Use allergy-proof pillows, mattress covers, and box spring covers.  Wash bed sheets and blankets every week in hot water and dry them in a dryer.  Use blankets that are made of polyester or cotton.  Clean bathrooms and kitchens with bleach. If possible, have someone repaint the walls in these rooms with mold-resistant paint. Keep out of the rooms that are being cleaned and painted.  Wash hands frequently. SEEK MEDICAL CARE IF:   You have wheezing, shortness of breath, or a cough even if taking medicine to prevent attacks.  The colored mucus you cough up (sputum) is thicker than usual.  Your sputum changes from clear or white to yellow, green, gray, or bloody.  You have any problems that may be related to the medicines you are taking (such as a rash, itching, swelling, or trouble breathing).  You are using a reliever medicine more than 2-3 times per week.  Your peak flow is still at 50-79% of your personal best after following your action plan for 1 hour.  You have a fever. SEEK IMMEDIATE MEDICAL CARE IF:   You seem to be getting worse and are unresponsive to treatment during an asthma attack.  You are short of breath even at rest.  You get short of breath when doing very little physical activity.  You have difficulty eating, drinking, or talking due to asthma symptoms.  You develop chest pain.  You develop a fast heartbeat.  You have a bluish color to your lips or fingernails.  You are light-headed, dizzy, or faint.  Your peak flow is less than 50% of your personal best.   This information is not intended to replace advice given to you by your health care provider. Make sure you discuss any  questions you have with your health care provider.   Document Released: 09/08/2005 Document Revised: 05/30/2015 Document Reviewed: 04/07/2013 Elsevier Interactive Patient Education 2016 [employer] Allergic Conjunctivitis Allergic conjunctivitis is inflammation of the clear membrane that covers the white part of your eye and the inner surface of your eyelid (conjunctiva), and it is caused by allergies. The blood vessels in the conjunctiva become inflamed, and this causes the eye to become red or pink, and it often causes itchiness in the eye. Allergic conjunctivitis cannot be spread by one person to another person (noncontagious). CAUSES This condition is caused by an allergic reaction. Common causes of an allergic reaction (allergens) include:  Dust.  Pollen.  Mold.  Animal dander or secretions. RISK FACTORS This condition is more likely to develop if you are exposed to high levels of allergens that cause the allergic reaction. This might include being outdoors when air pollen levels are high or being around animals that you are allergic to. SYMPTOMS Symptoms of this condition may include:  Eye redness.  Tearing of the eyes.  Watery eyes.  Itchy eyes.  Burning feeling in the eyes.  Clear drainage from the eyes.  Swollen eyelids. DIAGNOSIS This  condition may be diagnosed by medical history and physical exam. If you have drainage from your eyes, it may be tested to rule out other causes of conjunctivitis. TREATMENT Treatment for this condition often includes medicines. These may be eye drops, ointments, or oral medicines. They may be prescription medicines or over-the-counter medicines. HOME CARE INSTRUCTIONS  Take or apply medicines only as directed by your health care provider.  Do not touch or rub your eyes.  Do not wear contact lenses until the inflammation is gone. Wear glasses instead.  Do not wear eye makeup until the inflammation is gone.  Apply a cool, clean  washcloth to your eye for 10-20 minutes, 3-4 times a day.  Try to avoid whatever allergen is causing the allergic reaction. SEEK MEDICAL CARE IF:  Your symptoms get worse.  You have pus draining from your eye.  You have new symptoms.  You have a fever.   This information is not intended to replace advice given to you by your health care provider. Make sure you discuss any questions you have with your health care provider.   Document Released: 11/29/2002 Document Revised: 09/29/2014 Document Reviewed: 06/20/2014 Elsevier Interactive Patient Education [employer]

## 2016-03-04 ENCOUNTER — Encounter (HOSPITAL_COMMUNITY): Payer: Self-pay | Admitting: Emergency Medicine

## 2016-03-04 ENCOUNTER — Telehealth: Payer: Self-pay | Admitting: Physician Assistant

## 2016-03-04 ENCOUNTER — Ambulatory Visit (INDEPENDENT_AMBULATORY_CARE_PROVIDER_SITE_OTHER): Payer: Medicaid Other

## 2016-03-04 ENCOUNTER — Ambulatory Visit (HOSPITAL_COMMUNITY)
Admission: EM | Admit: 2016-03-04 | Discharge: 2016-03-04 | Disposition: A | Discharge status: Home / Self Care | Payer: Medicaid Other | Attending: Family Medicine | Admitting: Family Medicine

## 2016-03-04 DIAGNOSIS — R0602 Shortness of breath: Secondary | ICD-10-CM

## 2016-03-04 LAB — POCT I-STAT, CHEM 8
BUN: 9 mg/dL (ref 6–20)
CALCIUM ION: 1.16 mmol/L (ref 1.12–1.23)
Chloride: 100 mmol/L — ABNORMAL LOW (ref 101–111)
Creatinine, Ser: 1.2 mg/dL (ref 0.61–1.24)
Glucose, Bld: 88 mg/dL (ref 65–99)
HEMATOCRIT: 47 % (ref 39.0–52.0)
HEMOGLOBIN: 16 g/dL (ref 13.0–17.0)
Potassium: 3.9 mmol/L (ref 3.5–5.1)
SODIUM: 140 mmol/L (ref 135–145)
TCO2: 30 mmol/L (ref 0–100)

## 2016-03-04 NOTE — ED Provider Notes (Signed)
CSN: [id number]     Arrival date & time 03/04/16  1258 History   First MD Initiated Contact with Patient 03/04/16 1321     Chief Complaint  Patient presents with  . URI   (Consider location/radiation/quality/duration/timing/severity/associated sxs/prior Treatment) Patient is a 30 y.o. male presenting with URI. The history is provided by the patient. No language interpreter was used.  URI Presenting symptoms: congestion   Presenting symptoms: no cough   Severity:  Severe Onset quality:  Gradual Duration:  6 weeks Timing:  Constant Progression:  Worsening Chronicity:  New Relieved by:  Nothing Worsened by:  Nothing tried Ineffective treatments:  None tried Risk factors: no recent illness   Pt has had shortness of breath increasing over the past 6 weeks.  Pt was treated in ED with albuterol and prednisone.  Pt reports he has continued to be short of breath.  Pt also reports 12 pd weight gain in 10 days.    Past Medical History  Diagnosis Date  . Asthma    Past Surgical History  Procedure Laterality Date  . Arthroscopic repair acl     No family history on file. Social History  Substance Use Topics  . Smoking status: Never Smoker   . Smokeless tobacco: None  . Alcohol Use: Yes     Comment: social    Review of Systems  HENT: Positive for congestion.   Respiratory: Negative for cough.   All other systems reviewed and are negative.   Allergies  Review of patient's allergies indicates no known allergies.  Home Medications   Prior to Admission medications   Medication Sig Start Date End Date Taking? Authorizing Provider  albuterol (PROVENTIL HFA;VENTOLIN HFA) 108 (90 Base) MCG/ACT inhaler Inhale 2 puffs into the lungs every 6 (six) hours as needed for wheezing or shortness of breath.   Yes Historical Provider, MD  dicyclomine (BENTYL) 20 MG tablet Take 1 tablet (20 mg total) by mouth 2 (two) times daily. 12/13/15   Antony MaduraKelly Humes, PA-C  Olopatadine HCl (PATADAY) 0.2 % SOLN  Apply 1 drop to eye daily. 01/18/16   Elpidio AnisShari Upstill, PA-C  predniSONE (DELTASONE) 20 MG tablet Take 3 tablets (60 mg total) by mouth daily. 01/18/16   Elpidio AnisShari Upstill, PA-C  promethazine (PHENERGAN) 25 MG tablet Take 1 tablet (25 mg total) by mouth every 6 (six) hours as needed for nausea or vomiting. 12/13/15   Antony MaduraKelly Humes, PA-C   Meds Ordered and Administered this Visit  Medications - No data to display  BP 148/87 mmHg  Pulse 87  Temp(Src) 98.6 F (37 C) (Oral)  Resp 16  SpO2 96% No data found.   Physical Exam  Constitutional: He is oriented to person, place, and time. He appears well-developed and well-nourished.  HENT:  Head: Normocephalic.  Right Ear: External ear normal.  Left Ear: External ear normal.  Nose: Nose normal.  Mouth/Throat: Oropharynx is clear and moist.  Eyes: Conjunctivae and EOM are normal. Pupils are equal, round, and reactive to light.  Neck: Normal range of motion.  Cardiovascular: Normal rate.   Pulmonary/Chest: Effort normal.  Abdominal: Soft. He exhibits no distension.  Musculoskeletal: Normal range of motion.  Neurological: He is alert and oriented to person, place, and time.  Skin: Skin is warm.  Psychiatric: He has a normal mood and affect.  Nursing note and vitals reviewed.   ED Course  Procedures (including critical care time)  Labs Review Labs Reviewed  POCT I-STAT, CHEM 8 - Abnormal; Notable for the following:  Chloride 100 (*)    All other components within normal limits    Imaging Review Dg Chest 2 View  03/04/2016  CLINICAL DATA:  Shortness of breath and chest pain for 10 days EXAM: CHEST  2 VIEW COMPARISON:  January 17, 2016 FINDINGS: Lungs are clear. Heart size and pulmonary vascularity are normal. No adenopathy. No bone lesions. No pneumothorax. IMPRESSION: No edema or consolidation. Electronically Signed   By: Bretta Bang III M.D.   On: 03/04/2016 14:35     Visual Acuity Review  Right Eye Distance:   Left Eye Distance:    Bilateral Distance:    Right Eye Near:   Left Eye Near:    Bilateral Near:      EKG normal sinus rhythm  81 possible pericarditis,     MDM I spoke to Card Master who scheduled pt for appointment with Dr. Mayford Knife tomorrow.     1. Shortness of breath    An After Visit Summary was printed and given to the patient.    Lonia Skinner [place], PA-C 03/04/16 [phone]

## 2016-03-04 NOTE — Discharge Instructions (Signed)
Shortness of Breath Shortness of breath means you have trouble breathing. It could also mean that you have a medical problem. You should get immediate medical care for shortness of breath. CAUSES   Not enough oxygen in the air such as with high altitudes or a smoke-filled room.  Certain lung diseases, infections, or problems.  Heart disease or conditions, such as angina or heart failure.  Low red blood cells (anemia).  Poor physical fitness, which can cause shortness of breath when you exercise.  Chest or back injuries or stiffness.  Being overweight.  Smoking.  Anxiety, which can make you feel like you are not getting enough air. DIAGNOSIS  Serious medical problems can often be found during your physical exam. Tests may also be done to determine why you are having shortness of breath. Tests may include:  Chest X-rays.  Lung function tests.  Blood tests.  An electrocardiogram (ECG).  An ambulatory electrocardiogram. An ambulatory ECG records your heartbeat patterns over a 24-hour period.  Exercise testing.  A transthoracic echocardiogram (TTE). During echocardiography, sound waves are used to evaluate how blood flows through your heart.  A transesophageal echocardiogram (TEE).  Imaging scans. Your health care provider may not be able to find a cause for your shortness of breath after your exam. In this case, it is important to have a follow-up exam with your health care provider as directed.  TREATMENT  Treatment for shortness of breath depends on the cause of your symptoms and can vary greatly. HOME CARE INSTRUCTIONS   Do not smoke. Smoking is a common cause of shortness of breath. If you smoke, ask for help to quit.  Avoid being around chemicals or things that may bother your breathing, such as paint fumes and dust.  Rest as needed. Slowly resume your usual activities.  If medicines were prescribed, take them as directed for the full length of time directed. This  includes oxygen and any inhaled medicines.  Keep all follow-up appointments as directed by your health care provider. SEEK MEDICAL CARE IF:   Your condition does not improve in the time expected.  You have a hard time doing your normal activities even with rest.  You have any new symptoms. SEEK IMMEDIATE MEDICAL CARE IF:   Your shortness of breath gets worse.  You feel light-headed, faint, or develop a cough not controlled with medicines.  You start coughing up blood.  You have pain with breathing.  You have chest pain or pain in your arms, shoulders, or abdomen.  You have a fever.  You are unable to walk up stairs or exercise the way you normally do. MAKE SURE YOU:  Understand these instructions.  Will watch your condition.  Will get help right away if you are not doing well or get worse.   This information is not intended to replace advice given to you by your health care provider. Make sure you discuss any questions you have with your health care provider.   Document Released: 06/03/2001 Document Revised: 09/13/2013 Document Reviewed: 11/24/2011 Elsevier Interactive Patient Education 2016 [employer] Palpitations A palpitation is the feeling that your heartbeat is irregular or is faster than normal. It may feel like your heart is fluttering or skipping a beat. Palpitations are usually not a serious problem. However, in some cases, you may need further medical evaluation. CAUSES  Palpitations can be caused by:  Smoking.  Caffeine or other stimulants, such as diet pills or energy drinks.  Alcohol.  Stress and anxiety.  faster than normal. It may feel like your heart is fluttering or skipping a beat. Palpitations are usually not a serious problem. However, in some cases, you may need further medical evaluation.  CAUSES   Palpitations can be caused by:   Smoking.   Caffeine or other stimulants, such as diet pills or energy drinks.   Alcohol.   Stress and anxiety.   Strenuous physical activity.   Fatigue.   Certain medicines.   Heart disease, especially if you have a history of irregular heart rhythms (arrhythmias), such as atrial fibrillation, atrial flutter, or supraventricular tachycardia.   An improperly working pacemaker or defibrillator.  DIAGNOSIS   To find the cause of your palpitations, your  health care provider will take your medical history and perform a physical exam. Your health care provider may also have you take a test called an ambulatory electrocardiogram (ECG). An ECG records your heartbeat patterns over a 24-hour period. You may also have other tests, such as:   Transthoracic echocardiogram (TTE). During echocardiography, sound waves are used to evaluate how blood flows through your heart.   Transesophageal echocardiogram (TEE).   Cardiac monitoring. This allows your health care provider to monitor your heart rate and rhythm in real time.   Holter monitor. This is a portable device that records your heartbeat and can help diagnose heart arrhythmias. It allows your health care provider to track your heart activity for several days, if needed.   Stress tests by exercise or by giving medicine that makes the heart beat faster.  TREATMENT   Treatment of palpitations depends on the cause of your symptoms and can vary greatly. Most cases of palpitations do not require any treatment other than time, relaxation, and monitoring your symptoms. Other causes, such as atrial fibrillation, atrial flutter, or supraventricular tachycardia, usually require further treatment.  HOME CARE INSTRUCTIONS    Avoid:    Caffeinated coffee, tea, soft drinks, diet pills, and energy drinks.    Chocolate.    Alcohol.   Stop smoking if you smoke.   Reduce your stress and anxiety. Things that can help you relax include:    A method of controlling things in your body, such as your heartbeats, with your mind (biofeedback).    Yoga.    Meditation.    Physical activity such as swimming, jogging, or walking.   Get plenty of rest and sleep.  SEEK MEDICAL CARE IF:    You continue to have a fast or irregular heartbeat beyond 24 hours.   Your palpitations occur more often.  SEEK IMMEDIATE MEDICAL CARE IF:   You have chest pain or shortness of breath.   You have a severe headache.   You feel dizzy or you faint.  MAKE  SURE YOU:   Understand these instructions.   Will watch your condition.   Will get help right away if you are not doing well or get worse.     This information is not intended to replace advice given to you by your health care provider. Make sure you discuss any questions you have with your health care provider.     Document Released: 09/05/2000 Document Revised: 09/13/2013 Document Reviewed: 11/07/2011  Elsevier Interactive Patient Education 2016 [employer] Inc[employer]

## 2016-03-04 NOTE — ED Notes (Signed)
PT reports 1.5 months ago he was seen in the ED for SOB. He was put on breathing treatments and prednisone. Condition has not improved since then. For the past week it has worsened and PT has chest pressure and palpitations. PT reports SOB is exertional. PT also reports a productive cough.

## 2016-03-04 NOTE — Telephone Encounter (Signed)
Paged by Cheron [facility]lie [facility]. The patient seen for SOB. EKG abnormal --> read as acute pericarditis. However seems early repol. Confirmed with Dr. [place]. Per Keenan BachelorSofia the patient needs to seen by cardiologist for possible cardiac etiology of his symptoms. F/u has been arranged.   Lanai Conlee, PAC

## 2016-03-05 ENCOUNTER — Ambulatory Visit (INDEPENDENT_AMBULATORY_CARE_PROVIDER_SITE_OTHER): Payer: Medicaid Other | Admitting: Cardiology

## 2016-03-05 ENCOUNTER — Encounter: Payer: Self-pay | Admitting: Cardiology

## 2016-03-05 VITALS — BP 124/72 | HR 84 | Ht 76.0 in | Wt 356.0 lb

## 2016-03-05 DIAGNOSIS — R9431 Abnormal electrocardiogram [ECG] [EKG]: Secondary | ICD-10-CM

## 2016-03-05 DIAGNOSIS — R079 Chest pain, unspecified: Secondary | ICD-10-CM

## 2016-03-05 DIAGNOSIS — R0602 Shortness of breath: Secondary | ICD-10-CM | POA: Diagnosis not present

## 2016-03-05 NOTE — Patient Instructions (Signed)
Medication Instructions:  Your physician recommends that you continue on your current medications as directed. Please refer to the Current Medication list given to you today.   Labwork: TODAY: BNP, Troponin, Sed rate, CRP  Testing/Procedures: Your physician has requested that you have an echocardiogram. Echocardiography is a painless test that uses sound waves to create images of your heart. It provides your doctor with information about the size and shape of your heart and how well your heart's chambers and valves are working. This procedure takes approximately one hour. There are no restrictions for this procedure.  Follow-Up: Your physician recommends that you schedule a follow-up appointment in 1 WEEK with a PA or NP.   Any Other Special Instructions Will Be Listed Below (If Applicable).     If you need a refill on your cardiac medications before your next appointment, please call your pharmacy.

## 2016-03-05 NOTE — Progress Notes (Signed)
Cardiology Office Note    Date:  03/05/2016   ID:  Christopher Luna, DOB [birth year], MRN [id number]  PCP:  PROVIDER NOT IN SYSTEM  Cardiologist:  Armanda Magic, MD   Chief Complaint  Patient presents with  . Chest Pain  . Shortness of Breath    History of Present Illness:  Christopher Luna is a 30 y.o. male with a history of asthma who was seen at Urgent Care yesterday with complaints of chest congestion over the past 6 weeks with SOB.  He was treated with albuterol and prednisone in the ER back in April for possible URI with asthma exacerbation but continued to have SOB and also a 12 pound weight gain in 10 days. He presented to the Urgent Care yesterday and  EKG showed NSR and there was some concern for possible pericarditis with diffuse ST elevation.  Chest xray was clear with NAD.  He is now referred to Cardiology for further evaluation.  He says that the SOB is intermittent and has a mild cough with congestion in the upper airways.  He has not noticed any swelling in his legs.  He has been having chest pain recently.  He describes it as a sharp and dull pain at the same time across the chest and to the left of the sternum.  It is off and on but no associated SOB, nausea or diaphoretic.  Nothing makes the pain better or worse.  It is not positional.  It is not worse with deep breathing.      Past Medical History  Diagnosis Date  . Asthma   . Environmental allergies     Past Surgical History  Procedure Laterality Date  . Arthroscopic repair acl Right 2005    x2    Current Medications: Outpatient Prescriptions Prior to Visit  Medication Sig Dispense Refill  . albuterol (PROVENTIL HFA;VENTOLIN HFA) 108 (90 Base) MCG/ACT inhaler Inhale 2 puffs into the lungs every 6 (six) hours as needed for wheezing or shortness of breath. Reported on 03/05/2016    . dicyclomine (BENTYL) 20 MG tablet Take 1 tablet (20 mg total) by mouth 2 (two) times daily. (Patient not taking: Reported on 03/05/2016) 20  tablet 0  . Olopatadine HCl (PATADAY) 0.2 % SOLN Apply 1 drop to eye daily. (Patient not taking: Reported on 03/05/2016) 2.5 mL 0  . predniSONE (DELTASONE) 20 MG tablet Take 3 tablets (60 mg total) by mouth daily. (Patient not taking: Reported on 03/05/2016) 9 tablet 0  . promethazine (PHENERGAN) 25 MG tablet Take 1 tablet (25 mg total) by mouth every 6 (six) hours as needed for nausea or vomiting. (Patient not taking: Reported on 03/05/2016) 12 tablet 0   No facility-administered medications prior to visit.     Allergies:   Review of patient's allergies indicates no known allergies.   Social History   Social History  . Marital Status: Single    Spouse Name: N/A  . Number of Children: N/A  . Years of Education: N/A   Social History Main Topics  . Smoking status: Never Smoker   . Smokeless tobacco: None  . Alcohol Use: Yes     Comment: social  . Drug Use: No  . Sexual Activity: Not Asked   Other Topics Concern  . None   Social History Narrative     Family History:  The patient's family history includes Hypertension in his father and mother. There is no history of Asthma.   ROS:   Please see the  history of present illness.    ROS All other systems reviewed and are negative.   PHYSICAL EXAM:   VS:  BP 124/72 mmHg  Pulse 84  Ht  (1.93 m)  Wt 356 lb (161.481 kg)  BMI 43.35 kg/m2   GEN: Well nourished, well developed, in no acute distress HEENT: normal Neck: no JVD, carotid bruits, or masses Cardiac: RRR; no murmurs, rubs, or gallops,no edema.  Intact distal pulses bilaterally.  Respiratory:  clear to auscultation bilaterally, normal work of breathing GI: soft, nontender, nondistended, + BS MS: no deformity or atrophy Skin: warm and dry, no rash Neuro:  Alert and Oriented x 3, Strength and sensation are intact Psych: euthymic mood, full affect  Wt Readings from Last 3 Encounters:  03/05/16 356 lb (161.481 kg)  01/17/16 340 lb (154.223 kg)  12/15/15 344 lb  (156.037 kg)      Studies/Labs Reviewed:   EKG:  EKG is no ordered today.    Recent Labs: 12/15/2015: ALT 31; Platelets 255 03/04/2016: BUN 9; Creatinine, Ser 1.20; Hemoglobin 16.0; Potassium 3.9; Sodium 140   Lipid Panel No results found for: CHOL, TRIG, HDL, CHOLHDL, VLDL, LDLCALC, LDLDIRECT  Additional studies/ records that were reviewed today include:  none    ASSESSMENT:    1. Chest pain, unspecified chest pain type   2. Shortness of breath   3. SOB (shortness of breath)   4. Abnormal EKG      PLAN:  In order of problems listed above:  1. Chest pain that is atypical in presentation.  It is a sharp pain that is intermittent and not affected by deep breathing or position.  He also has SOB and has a significant history of asthma with recent exacerbations.  EKG shows diffuse ST elevation which most likely is secondary to early repolarization commonly seen in young african [place] men,  This also can be seen in acute pericarditis by his symptoms are not consistent with that.  His cxray was clear.  I suspect this is most likely more of an asthma flare.  I do not hear a pericardial friction rub on exam.  I will get a 2D echo to assess LVF and for pericardial effusion.  I will check a CRP and sed rate which should be elevated in acute inflammation with pericarditis.   2. SOB which I suspect is more related to asthma.  He has had a cough with some sputum production.  He has had an 18 lb weight gain in the last 10 days but has no edema on exam and no rales on lung exam.  I will check a BNP and 2D echo. 3. Asthma     Medication Adjustments/Labs and Tests Ordered: Current medicines are reviewed at length with the patient today.  Concerns regarding medicines are outlined above.  Medication changes, Labs and Tests ordered today are listed in the Patient Instructions below.  Patient Instructions  Medication Instructions:  Your physician recommends that you continue on your current  medications as directed. Please refer to the Current Medication list given to you today.   Labwork: TODAY: BNP, Troponin, Sed rate, CRP  Testing/Procedures: Your physician has requested that you have an echocardiogram. Echocardiography is a painless test that uses sound waves to create images of your heart. It provides your doctor with information about the size and shape of your heart and how well your heart's chambers and valves are working. This procedure takes approximately one hour. There are no restrictions for this procedure.  Follow-Up: Your physician recommends that you schedule a follow-up appointment in 1 WEEK with a PA or NP.   Any Other Special Instructions Will Be Listed Below (If Applicable).     If you need a refill on your cardiac medications before your next appointment, please call your pharmacy.       Signed, Armanda Magicraci Turner, MD  03/05/2016 8:49 PM    [facility] [address], [place], [place]  [address] Phone: [phone]; Fax: [phone]

## 2016-03-06 ENCOUNTER — Encounter (HOSPITAL_COMMUNITY): Payer: Self-pay | Admitting: Emergency Medicine

## 2016-03-06 ENCOUNTER — Other Ambulatory Visit: Payer: Self-pay

## 2016-03-06 ENCOUNTER — Ambulatory Visit (HOSPITAL_COMMUNITY): Payer: Medicaid Other | Attending: Cardiovascular Disease

## 2016-03-06 ENCOUNTER — Other Ambulatory Visit: Payer: Medicaid Other

## 2016-03-06 ENCOUNTER — Telehealth: Payer: Self-pay | Admitting: Cardiology

## 2016-03-06 ENCOUNTER — Other Ambulatory Visit (HOSPITAL_COMMUNITY): Payer: Self-pay | Admitting: *Deleted

## 2016-03-06 ENCOUNTER — Ambulatory Visit (HOSPITAL_COMMUNITY)
Admission: EM | Admit: 2016-03-06 | Discharge: 2016-03-06 | Disposition: A | Discharge status: Home / Self Care | Payer: Medicaid Other

## 2016-03-06 DIAGNOSIS — I5022 Chronic systolic (congestive) heart failure: Secondary | ICD-10-CM

## 2016-03-06 DIAGNOSIS — Z6841 Body Mass Index (BMI) 40.0 and over, adult: Secondary | ICD-10-CM | POA: Diagnosis not present

## 2016-03-06 DIAGNOSIS — R079 Chest pain, unspecified: Secondary | ICD-10-CM

## 2016-03-06 DIAGNOSIS — R0602 Shortness of breath: Secondary | ICD-10-CM | POA: Diagnosis not present

## 2016-03-06 DIAGNOSIS — I517 Cardiomegaly: Secondary | ICD-10-CM | POA: Diagnosis not present

## 2016-03-06 LAB — ECHOCARDIOGRAM COMPLETE
AVLVOTPG: 4 mmHg
Ao-asc: 34 cm
CHL CUP MV DEC (S): 151
E decel time: 151 msec
E/e' ratio: 9.17
FS: 40 % (ref 28–44)
IV/PV OW: 0.83
LA diam index: 1.5 cm/m2
LA vol A4C: 59 ml
LA vol index: 21.6 mL/m2
LASIZE: 45 mm
LAVOL: 65 mL
LDCA: 4.15 cm2
LEFT ATRIUM END SYS DIAM: 45 mm
LV E/e'average: 9.17
LV PW d: 12.9 mm — AB (ref 0.6–1.1)
LV TDI E'MEDIAL: 7.68
LV dias vol index: 35 mL/m2
LVDIAVOL: 105 mL (ref 62–150)
LVEEMED: 9.17
LVELAT: 10.9 cm/s
LVOT VTI: 17.1 cm
LVOT diameter: 23 mm
LVOTPV: 96.3 cm/s
LVOTSV: 71 mL
Lateral S' vel: 13.4 cm/s
MV Peak grad: 4 mmHg
MV pk A vel: 52.8 m/s
MV pk E vel: 100 m/s
TDI e' lateral: 10.9

## 2016-03-06 LAB — SEDIMENTATION RATE: SED RATE: 1 mm/h (ref 0–15)

## 2016-03-06 LAB — BRAIN NATRIURETIC PEPTIDE: Brain Natriuretic Peptide: <4 pg/mL (ref <100)

## 2016-03-06 LAB — C-REACTIVE PROTEIN

## 2016-03-06 MED ORDER — PERFLUTREN LIPID MICROSPHERE
1.0000 mL | INTRAVENOUS | Status: AC | PRN
  Administered 2016-03-06: 3 mL via INTRAVENOUS

## 2016-03-06 NOTE — Telephone Encounter (Signed)
-----   Message from Quintella Reichertraci R Turner, MD sent at 03/06/2016  8:59 AM EDT ----- Sed rate is normal

## 2016-03-06 NOTE — ED Notes (Signed)
[facility] and appt made with [facility].  Patient went for echo this morning and reports technician reports elevated blood pressures.   Unable to see documentation of elevated blood pressure in epic.  Patient denies chest pain.  Patient says sob is the same as when he initial sought treatment at [facility] on [facility]/13.  Patient reports he does not wish to see another physician, nothing has changed.  Patient says all he wanted was to see what blood pressure was reading.  Blood pressure is normal.  Patient has no chest pain.  Patient has no new complaints.  Patient is in department with another patient that is being seen .

## 2016-03-06 NOTE — Telephone Encounter (Signed)
Follow up ° ° ° ° ° °Returning the nurses call ° ° ° ° ° ° °

## 2016-03-06 NOTE — Telephone Encounter (Signed)
Per DPR sheet, left detailed message for patient that so far, labs are normal.  Informed him he will be called when other labs are complete.

## 2016-03-10 LAB — TROPONIN I

## 2016-03-10 NOTE — Progress Notes (Signed)
Cardiology Office Note:    Date:  03/11/2016   ID:  Christopher Luna, DOB [birth year], MRN [id number]  PCP:  PROVIDER NOT IN SYSTEM  Cardiologist:  Dr. Fransico Him   Electrophysiologist:  n/a  Referring MD: No ref. provider found   Chief Complaint  Patient presents with  . Follow-up    chest pain    History of Present Illness:     Christopher Luna is a 30 y.o. male with a hx of Asthma. Patient was evaluated by Dr. Radford Pax 614/17 after going to urgent care with complaints of chest congestion and dyspnea. ECG at urgent care was concerning for possible pericarditis. Chest x-ray was unremarkable. Chest pain was felt to be atypical. It was not related to deep breathing or position. BNP was normal at < 4. CRP and sedimentation rate were both normal. Echocardiogram was obtained and demonstrated normal LV function with moderate LAE. There was no evidence of pericardial effusion. Dr. Radford Pax suggested getting an exercise treadmill test to rule out ischemia and follow-up with primary care. He returns for follow-up today. His ETT is scheduled for later this afternoon.  Here for FU.  Here with his daughter.  He still notes occ sharp substernal pain.  This is brief and last seconds.  He denies significant dyspnea or wheezing.  Denies syncope, orthopnea, PND, edema.  He sleeps with CPAP.   Past Medical History  Diagnosis Date  . Asthma   . Environmental allergies   . OSA (obstructive sleep apnea)     on CPAP  . History of echocardiogram     a. Echo 6/17: EF 60-65%, no RWMA, diastolic function normal, mod LAE    Current Medications: Outpatient Prescriptions Prior to Visit  Medication Sig Dispense Refill  . albuterol (PROVENTIL HFA;VENTOLIN HFA) 108 (90 Base) MCG/ACT inhaler Inhale 2 puffs into the lungs every 6 (six) hours as needed for wheezing or shortness of breath. Reported on 03/05/2016     No facility-administered medications prior to visit.      Allergies:   Review of patient's allergies  indicates no known allergies.   Social History   Social History  . Marital Status: Single    Spouse Name: N/A  . Number of Children: N/A  . Years of Education: N/A   Social History Main Topics  . Smoking status: Never Smoker   . Smokeless tobacco: None  . Alcohol Use: Yes     Comment: social  . Drug Use: No  . Sexual Activity: Not Asked   Other Topics Concern  . None   Social History Narrative     ROS:   Please see the history of present illness.    Review of Systems  Constitution: Positive for malaise/fatigue and weight gain.  Cardiovascular: Positive for chest pain.  Respiratory: Positive for shortness of breath.   Musculoskeletal: Positive for joint pain.   All other systems reviewed and are negative.   Physical Exam:    VS:  BP 112/70 mmHg  Pulse 72  Ht _0  (1.93 m)  Wt 358 lb 6.4 oz (162.569 kg)  BMI 43.64 kg/m2  SpO2 97%   Physical Exam  Constitutional: He is oriented to person, place, and time. He appears well-developed and well-nourished.  Neck: Neck supple. Carotid bruit is not present.  Cardiovascular: Regular rhythm, S1 normal and S2 normal.  Exam reveals no gallop and no friction rub.   No murmur heard. Pulmonary/Chest: Effort normal. He has no wheezes. He has no rhonchi. He has no  rales.  Abdominal: Soft. There is no tenderness.  Musculoskeletal: He exhibits no edema.  Neurological: He is alert and oriented to person, place, and time.  Skin: Skin is warm and dry.    Wt Readings from Last 3 Encounters:  03/11/16 358 lb 6.4 oz (162.569 kg)  03/05/16 356 lb (161.481 kg)  01/17/16 340 lb (154.223 kg)      Studies/Labs Reviewed:     EKG:  EKG is not ordered today.  The ekg ordered today demonstrates n/a  Recent Labs: 12/15/2015: ALT 31; Platelets 255 03/04/2016: BUN 9; Creatinine, Ser 1.20; Hemoglobin 16.0; Potassium 3.9; Sodium 140 03/05/2016: Brain Natriuretic Peptide <4.0   Recent Lipid Panel No results found for: CHOL, TRIG, HDL,  CHOLHDL, VLDL, LDLCALC, LDLDIRECT  Additional studies/ records that were reviewed today include:   Echo 03/06/16 - Left ventricle: The cavity size was normal. Wall thickness was  normal. Systolic function was normal. The estimated ejection  fraction was in the range of 60% to 65%. Wall motion was normal;  there were no regional wall motion abnormalities. Left  ventricular diastolic function parameters were normal. Acoustic  contrast opacification revealed no evidence ofthrombus. - Left atrium: The atrium was moderately dilated.   ASSESSMENT:     1. Chest pain, unspecified chest pain type     PLAN:     In order of problems listed above:  1. Chest pain - Recent echo with normal LVEF.  His BNP, CRP and ESR were normal.  His symptoms are felt to likely be related to Asthma.  We discussed the results of his testing thus far.  He has an ETT arranged for this afternoon.  If this is low risk FU with Dr. Fransico Him as needed.  Otherwise, FU with PCP.  I advised him to try Prilosec OTC 20 mg QD x weeks.  If this helps his symptoms, he can d/w his PCP +/- continuing or further testing.     Medication Adjustments/Labs and Tests Ordered: Current medicines are reviewed at length with the patient today.  Concerns regarding medicines are outlined above.  Medication changes, Labs and Tests ordered today are outlined in the Patient Instructions noted below. Patient Instructions  Medication Instructions:  Try to get Prilosec OTC (20 mg) and take 1 tablet daily for 2 weeks to see if this helps you any.  If it does, talk to your primary care doctor about continuing.  If it makes no difference, do not continue it.  You can buy Prilosec OTC at any pharmacy over the counter without a prescription.  Labwork: None   Testing/Procedures: Complete your stress test scheduled today.   Follow-Up: Dr. Fransico Him as needed. Make sure you schedule follow up with your primary care doctor for your asthma.    Any Other Special Instructions Will Be Listed Below (If Applicable).  If you need a refill on your cardiac medications before your next appointment, please call your pharmacy.    Signed, Richardson Dopp, PA-C  03/11/2016 12:00 PM    [facility] [place], [place], [place]  [address] Phone: [phone]; Fax: [phone]

## 2016-03-11 ENCOUNTER — Ambulatory Visit (INDEPENDENT_AMBULATORY_CARE_PROVIDER_SITE_OTHER): Payer: Medicaid Other | Admitting: Physician Assistant

## 2016-03-11 ENCOUNTER — Other Ambulatory Visit: Payer: Self-pay

## 2016-03-11 ENCOUNTER — Encounter (INDEPENDENT_AMBULATORY_CARE_PROVIDER_SITE_OTHER): Payer: Medicaid Other

## 2016-03-11 ENCOUNTER — Encounter: Payer: Self-pay | Admitting: Physician Assistant

## 2016-03-11 VITALS — BP 112/70 | HR 72 | Ht 76.0 in | Wt 358.4 lb

## 2016-03-11 DIAGNOSIS — R079 Chest pain, unspecified: Secondary | ICD-10-CM

## 2016-03-11 DIAGNOSIS — R0602 Shortness of breath: Secondary | ICD-10-CM

## 2016-03-11 LAB — EXERCISE TOLERANCE TEST
CHL CUP MPHR: 191 {beats}/min
CHL RATE OF PERCEIVED EXERTION: 15
CSEPEDS: 0 s
CSEPEW: 5.7 METS
CSEPPHR: 129 {beats}/min
Exercise duration (min): 4 min
Percent HR: 67 %
Rest HR: 97 {beats}/min

## 2016-03-11 NOTE — Patient Instructions (Signed)
Medication Instructions:  Try to get Prilosec OTC (20 mg) and take 1 tablet daily for 2 weeks to see if this helps you any.  If it does, talk to your primary care doctor about continuing.  If it makes no difference, do not continue it.  You can buy Prilosec OTC at any pharmacy over the counter without a prescription.  Labwork: None   Testing/Procedures: Complete your stress test scheduled today.   Follow-Up: Dr. Armanda Magicraci Turner as needed. Make sure you schedule follow up with your primary care doctor for your asthma.   Any Other Special Instructions Will Be Listed Below (If Applicable).  If you need a refill on your cardiac medications before your next appointment, please call your pharmacy.

## 2016-03-13 ENCOUNTER — Telehealth: Payer: Self-pay

## 2016-03-13 DIAGNOSIS — R079 Chest pain, unspecified: Secondary | ICD-10-CM

## 2016-03-13 DIAGNOSIS — R9431 Abnormal electrocardiogram [ECG] [EKG]: Secondary | ICD-10-CM

## 2016-03-13 DIAGNOSIS — R0602 Shortness of breath: Secondary | ICD-10-CM

## 2016-03-13 NOTE — Telephone Encounter (Signed)
-----   Message from Quintella Reichertraci R Turner, MD sent at 03/11/2016  3:14 PM EDT ----- Submaximal stress test therefore nondiagnostic - [facility]

## 2016-03-13 NOTE — Telephone Encounter (Signed)
Informed patient of results and verbal understanding expressed.  Christopher DunnLexiscan myoview ordered for scheduling. Instructions reviewed in detail. Patient understands it will be a 2 day test. He understands to fast for 2 hours prior to test and to avoid caffeine/decaf for 12 hours prior to test.  Patient agrees with treatment plan.

## 2016-03-18 ENCOUNTER — Telehealth: Payer: Self-pay | Admitting: Physician Assistant

## 2016-03-18 DIAGNOSIS — R079 Chest pain, unspecified: Secondary | ICD-10-CM

## 2016-03-18 NOTE — Telephone Encounter (Signed)
Spoke with Mr. Christopher Luna to make the appointment for the Nuclear Stress Test.  Per patient don't want to do the test because,  I don't want that stuff in my heart.  I was told that they had enough information when I did the GXT.

## 2016-03-18 NOTE — Telephone Encounter (Signed)
His stress test was not normal it was inconclusive as he did not attain his 85% MPHR and therefore cannot assess for ischemia.  Options are repeat ETT or nuclear stress test.  Given his obesity a coronary CTA would be difficult to interpret.

## 2016-03-18 NOTE — Telephone Encounter (Signed)
Looks like this should had gone to Dr. Mayford Knifeurner . Dr. Mayford Knifeurner had ordered original GXT and looks like she ordered Lexiscan. I will forward to Karsten FellsKaty Kemp, RN.

## 2016-03-26 NOTE — Addendum Note (Signed)
Addended by: Gunnar FusiKEMP, KATHRYN A on: 03/26/2016 01:18 PM   Modules accepted: Orders

## 2016-03-26 NOTE — Telephone Encounter (Signed)
Patient agrees to repeat GXT.  Instructions reviewed. He understands he will be called to schedule.

## 2016-05-05 ENCOUNTER — Encounter: Payer: Self-pay | Admitting: *Deleted

## 2016-05-05 NOTE — Progress Notes (Signed)
Patient ID: [name], male   DOB: Dec 18, 1985, 30 y.o.   MRN: [id number] Patient did not show up for 05/05/16, 9:00 AM, appointment to have a GXT.

## 2016-06-01 ENCOUNTER — Encounter: Payer: Self-pay | Admitting: Emergency Medicine

## 2016-06-01 ENCOUNTER — Ambulatory Visit
Admission: EM | Admit: 2016-06-01 | Discharge: 2016-06-01 | Disposition: A | Discharge status: Home / Self Care | Payer: Medicaid Other | Attending: Family Medicine | Admitting: Family Medicine

## 2016-06-01 DIAGNOSIS — F519 Sleep disorder not due to a substance or known physiological condition, unspecified: Secondary | ICD-10-CM | POA: Insufficient documentation

## 2016-06-01 DIAGNOSIS — J45909 Unspecified asthma, uncomplicated: Secondary | ICD-10-CM | POA: Insufficient documentation

## 2016-06-01 DIAGNOSIS — R200 Anesthesia of skin: Secondary | ICD-10-CM | POA: Diagnosis present

## 2016-06-01 DIAGNOSIS — G4733 Obstructive sleep apnea (adult) (pediatric): Secondary | ICD-10-CM | POA: Insufficient documentation

## 2016-06-01 DIAGNOSIS — Z79899 Other long term (current) drug therapy: Secondary | ICD-10-CM | POA: Insufficient documentation

## 2016-06-01 DIAGNOSIS — R202 Paresthesia of skin: Secondary | ICD-10-CM | POA: Diagnosis not present

## 2016-06-01 LAB — GLUCOSE, CAPILLARY: Glucose-Capillary: 86 mg/dL (ref 65–99)

## 2016-06-01 NOTE — ED Provider Notes (Addendum)
[facility]    CSN: [id number] Arrival date & time: 06/01/16  1608  First Provider Contact:  None       History   Chief Complaint Chief Complaint  Patient presents with  . Numbness    HPI Christopher Luna is a 30 y.o. male.   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 Patient states that his symptoms started on Tuesday. He denies any trauma or injury to the area he is a truck driver and is right hand dominant. He has no chest pain, shortness of breath, diaphoresis, neck pain, headache. His symptoms do not go down into his upper arm or into his hand. He does not have any weakness of the right upper extremity. On further questioning the patient does state that occasionally he does feel tingling or numbness in different extremities but goes away whether quickly. He denies any history of diabetes in the past. He is currently in the process of scheduling another cardiac test which she believes is stress test.  Past Medical History:  Diagnosis Date  . Asthma   . Environmental allergies   . History of echocardiogram    a. Echo 6/17: EF 60-65%, no RWMA, diastolic function normal, mod LAE  . OSA (obstructive sleep apnea)    on CPAP    Patient Active Problem List   Diagnosis Date Noted  . Chest pain 03/05/2016  . SOB (shortness of breath) 03/05/2016  . Abnormal EKG 03/05/2016    Past Surgical History:  Procedure Laterality Date  . ARTHROSCOPIC REPAIR ACL Right 2005   x2       Home Medications    Prior to Admission medications   Medication Sig Start Date End Date Taking? Authorizing Provider  albuterol (PROVENTIL HFA;VENTOLIN HFA) 108 (90 Base) MCG/ACT inhaler Inhale 2 puffs into the lungs every 6 (six) hours as needed for wheezing or shortness of breath. Reported on 03/05/2016    Historical Provider, MD    Family History Family History  Problem Relation Age of Onset  . Hypertension Mother   .  Hypertension Father   . Asthma Neg Hx     Social History Social History  Substance Use Topics  . Smoking status: Never Smoker  . Smokeless tobacco: Never Used  . Alcohol use Yes     Comment: social     Allergies   Review of patient's allergies indicates no known allergies.   Review of Systems Review of Systems: negative except mentioned above.    Physical Exam Triage Vital Signs ED Triage Vitals  Enc Vitals Group     BP 06/01/16 1617 (!) 159/92     Pulse Rate 06/01/16 1617 82     Resp 06/01/16 1617 18     Temp 06/01/16 1617 97.2 F (36.2 C)     Temp Source 06/01/16 1617 Tympanic     SpO2 06/01/16 1617 95 %     Weight 06/01/16 1616 (!) 360 lb (163.3 kg)     Height 06/01/16 1616 6\' 4"  (1.93 m)     Head Circumference --      Peak Flow --      Pain Score 06/01/16 1617 3     Pain Loc --      Pain Edu? --      Excl. in GC? --    No data found.   Updated Vital Signs BP (!) 159/92 (BP Location: Left Arm)   Pulse 82   Temp  97.2 F (36.2 C) (Tympanic)   Resp 18   Ht 6\' 4"  (1.93 m)   Wt (!) 360 lb (163.3 kg)   SpO2 95%   BMI 43.82 kg/m   Physical Exam:  GENERAL: NAD HEENT: no pharyngeal erythema, no exudate RESP: CTA B CARD: RRR MSK: FROM of all extremities, 5/5 strength of all extremities, decreased sensation along right anterior shoulder area which doesn't extend to upper arm or forearm/hand, FROM of neck, -Spurlings, -Homans NEURO: CN II-XII grossly intact    UC Treatments / Results  Labs (all labs ordered are listed, but only abnormal results are displayed) Labs Reviewed - No data to display  EKG  EKG Interpretation None     EKG shows normal sinus rhythm, no ST elevations or depressions, P waves present. Agree with reading of EKG on printout.   Radiology No results found.  Procedures Procedures (including critical care time)  Medications Ordered in UC Medications - No data to display   Initial Impression / Assessment and Plan / UC  Course  I have reviewed the triage vital signs and the nursing notes.  Pertinent labs & imaging results that were available during my care of the patient were reviewed by me and considered in my medical decision making (see chart for details).  Clinical Course   A/P: Right shoulder numbness - there appears to be a focal area of numbness, EKG appears normal, patient has no other symptoms, unsure of etiology, I would recommend outpatient follow-up with his primary care physician for further workup with other labs such as B12, folate. Fingerstick blood sugar here in the office was normal. Further neurological workup may be needed if his symptoms persist/worsen. I have asked the patient that if he has any shortness of breath, chest pain, worsening symptoms to go immediately to the ER. Patient addresses understanding of this.  Final Clinical Impressions(s) / UC Diagnoses   Final diagnoses:  None    New Prescriptions New Prescriptions   No medications on file     Jolene Provost, MD 06/01/16 3086    Jolene Provost, MD 06/01/16 [phone]

## 2016-06-01 NOTE — ED Triage Notes (Signed)
Patient c/o numbness that goes down his right arm that started 2 days ago.  Patient denies chest pain or SOB.

## 2016-06-01 NOTE — Discharge Instructions (Signed)
Follow-up with primary care physician for further evaluation and treatment. If symptoms worsen I do recommend that patient go to the ER for further evaluation and treatment as discussed. Encourage patient to sleep on that right shoulder to see if symptoms resolve.

## 2016-06-03 ENCOUNTER — Telehealth: Payer: Self-pay | Admitting: Cardiology

## 2016-06-03 NOTE — Telephone Encounter (Signed)
I have attempted to reach patient several times please see the notes below. I have removed the order from workque due to several attempts no response from patient. stpegram   06/03/2016 no answer no voicemail. unable to reach pt. stpegram  05/12/2016 called pt he answered the phone acknowledge it was him I told him who i was he hung up the phone. I am trying to schedule pt for his GXT. stpegram  04/01/2016 pt wants a call back this evening to see if he can schedule appt. stpegram  03/26/2016 left message to call to schedule treadmill stress test.[email or web address]

## 2016-11-09 ENCOUNTER — Encounter: Payer: Self-pay | Admitting: Emergency Medicine

## 2016-11-09 ENCOUNTER — Emergency Department
Admission: EM | Admit: 2016-11-09 | Discharge: 2016-11-09 | Disposition: A | Discharge status: Home / Self Care | Payer: Medicaid Other | Attending: Emergency Medicine | Admitting: Emergency Medicine

## 2016-11-09 ENCOUNTER — Emergency Department: Payer: Medicaid Other

## 2016-11-09 DIAGNOSIS — Y929 Unspecified place or not applicable: Secondary | ICD-10-CM | POA: Insufficient documentation

## 2016-11-09 DIAGNOSIS — Y999 Unspecified external cause status: Secondary | ICD-10-CM | POA: Diagnosis not present

## 2016-11-09 DIAGNOSIS — Z79899 Other long term (current) drug therapy: Secondary | ICD-10-CM | POA: Insufficient documentation

## 2016-11-09 DIAGNOSIS — S9031XA Contusion of right foot, initial encounter: Secondary | ICD-10-CM

## 2016-11-09 DIAGNOSIS — T07XXXA Unspecified multiple injuries, initial encounter: Secondary | ICD-10-CM

## 2016-11-09 DIAGNOSIS — Z23 Encounter for immunization: Secondary | ICD-10-CM | POA: Diagnosis not present

## 2016-11-09 DIAGNOSIS — S51811A Laceration without foreign body of right forearm, initial encounter: Secondary | ICD-10-CM | POA: Diagnosis not present

## 2016-11-09 DIAGNOSIS — S59911A Unspecified injury of right forearm, initial encounter: Secondary | ICD-10-CM | POA: Diagnosis present

## 2016-11-09 DIAGNOSIS — Y939 Activity, unspecified: Secondary | ICD-10-CM | POA: Diagnosis not present

## 2016-11-09 DIAGNOSIS — W25XXXA Contact with sharp glass, initial encounter: Secondary | ICD-10-CM | POA: Diagnosis not present

## 2016-11-09 DIAGNOSIS — J45909 Unspecified asthma, uncomplicated: Secondary | ICD-10-CM | POA: Diagnosis not present

## 2016-11-09 MED ORDER — OXYCODONE-ACETAMINOPHEN 5-325 MG PO TABS
2.0000 | ORAL_TABLET | Freq: Once | ORAL | Status: AC
  Administered 2016-11-09: 2 via ORAL

## 2016-11-09 MED ORDER — TETANUS-DIPHTH-ACELL PERTUSSIS 5-2.5-18.5 LF-MCG/0.5 IM SUSP
0.5000 mL | Freq: Once | INTRAMUSCULAR | Status: AC
  Administered 2016-11-09: 0.5 mL via INTRAMUSCULAR
  Filled 2016-11-09: qty 0.5

## 2016-11-09 MED ORDER — BACITRACIN ZINC 500 UNIT/GM EX OINT
TOPICAL_OINTMENT | CUTANEOUS | Status: AC
  Filled 2016-11-09: qty 0.9

## 2016-11-09 MED ORDER — OXYCODONE-ACETAMINOPHEN 5-325 MG PO TABS
ORAL_TABLET | ORAL | Status: AC
  Administered 2016-11-09: 2 via ORAL
  Filled 2016-11-09: qty 2

## 2016-11-09 MED ORDER — CEPHALEXIN 500 MG PO CAPS
500.0000 mg | ORAL_CAPSULE | Freq: Once | ORAL | Status: AC
  Administered 2016-11-09: 500 mg via ORAL
  Filled 2016-11-09: qty 1

## 2016-11-09 MED ORDER — LIDOCAINE HCL (PF) 1 % IJ SOLN
INTRAMUSCULAR | Status: AC
  Administered 2016-11-09: 5 mL via INTRADERMAL
  Filled 2016-11-09: qty 5

## 2016-11-09 MED ORDER — CEPHALEXIN 500 MG PO CAPS: 500.0000 mg | ORAL_CAPSULE | Freq: Two times a day (BID) | ORAL | 0 refills | Status: DC

## 2016-11-09 MED ORDER — LIDOCAINE HCL (PF) 1 % IJ SOLN
5.0000 mL | Freq: Once | INTRAMUSCULAR | Status: AC
  Administered 2016-11-09: 5 mL via INTRADERMAL

## 2016-11-09 MED ORDER — ASPIRIN 81 MG PO CHEW
CHEWABLE_TABLET | ORAL | Status: DC
Start: 2016-11-09 — End: 2016-11-09
  Filled 2016-11-09: qty 4

## 2016-11-09 MED ORDER — BACIT-POLY-NEO HC 1 % EX OINT
TOPICAL_OINTMENT | Freq: Once | CUTANEOUS | Status: AC
  Administered 2016-11-09: 1 via TOPICAL
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
## 2017-01-06 ENCOUNTER — Encounter: Payer: Self-pay | Admitting: Gynecology

## 2017-01-06 ENCOUNTER — Ambulatory Visit
Admission: EM | Admit: 2017-01-06 | Discharge: 2017-01-06 | Disposition: A | Discharge status: Home / Self Care | Payer: Medicaid Other | Attending: Family Medicine | Admitting: Family Medicine

## 2017-01-06 DIAGNOSIS — R05 Cough: Secondary | ICD-10-CM | POA: Diagnosis not present

## 2017-01-06 DIAGNOSIS — J4521 Mild intermittent asthma with (acute) exacerbation: Secondary | ICD-10-CM | POA: Diagnosis not present

## 2017-01-06 DIAGNOSIS — R0602 Shortness of breath: Secondary | ICD-10-CM | POA: Diagnosis not present

## 2017-01-06 MED ORDER — ALBUTEROL SULFATE HFA 108 (90 BASE) MCG/ACT IN AERS: 1.0000 | INHALATION_SPRAY | Freq: Four times a day (QID) | RESPIRATORY_TRACT | 2 refills | Status: AC | PRN

## 2017-01-06 MED ORDER — IPRATROPIUM-ALBUTEROL 0.5-2.5 (3) MG/3ML IN SOLN
3.0000 mL | Freq: Once | RESPIRATORY_TRACT | Status: AC
  Administered 2017-01-06: 3 mL via RESPIRATORY_TRACT

## 2017-01-06 MED ORDER — PREDNISONE 20 MG PO TABS: ORAL_TABLET | ORAL | 0 refills | Status: DC

## 2017-01-06 MED ORDER — FLUTICASONE PROPIONATE 50 MCG/ACT NA SUSP: 2.0000 | Freq: Every day | NASAL | 0 refills | Status: DC

## 2017-01-06 NOTE — ED Provider Notes (Signed)
CSN: [id number]     Arrival date & time 01/06/17  1633 History   First MD Initiated Contact with Patient 01/06/17 1839     Chief Complaint  Patient presents with  . Asthma   (Consider location/radiation/quality/duration/timing/severity/associated sxs/prior Treatment) HPI  [name] who presents with asthma and allergies. He states that he gets asthma usually only this time a year. His years been particularly worse. He's been using his albuterol inhaler and has only 30 more cycles left on it. Recently transferred here from [facility] and has not been able to establish with a primary care. He states he has some nasal congestion. He said no fever or chills. O2 sats are 96% on room air respirations 18 temperature 98.8 pulse 86.       Past Medical History:  Diagnosis Date  . Asthma   . Environmental allergies   . History of echocardiogram    a. Echo 6/17: EF 60-65%, no RWMA, diastolic function normal, mod LAE  . OSA (obstructive sleep apnea)    on CPAP   Past Surgical History:  Procedure Laterality Date  . ARTHROSCOPIC REPAIR ACL Right 2005   x2   Family History  Problem Relation Age of Onset  . Hypertension Mother   . Hypertension Father   . Asthma Neg Hx    Social History  Substance Use Topics  . Smoking status: Never Smoker  . Smokeless tobacco: Never Used  . Alcohol use Yes     Comment: social    Review of Systems  Constitutional: Positive for activity change. Negative for appetite change, chills, diaphoresis, fatigue and fever.  HENT: Positive for congestion and sneezing.   Respiratory: Positive for cough and shortness of breath. Negative for wheezing and stridor.   All other systems reviewed and are negative.   Allergies  Patient has no known allergies.  Home Medications   Prior to Admission medications   Medication Sig Start Date End Date Taking? Authorizing Provider  albuterol (PROVENTIL HFA;VENTOLIN HFA) 108 (90 Base)  MCG/ACT inhaler Inhale 1-2 puffs into the lungs every 6 (six) hours as needed for wheezing or shortness of breath. Use with spacer 01/06/17   Lutricia Feil, PA-C  fluticasone (FLONASE) 50 MCG/ACT nasal spray Place 2 sprays into both nostrils daily. 01/06/17   Lutricia Feil, PA-C  predniSONE (DELTASONE) 20 MG tablet Take 2 tablets (40 mg) daily by mouth 01/06/17   Lutricia Feil, PA-C   Meds Ordered and Administered this Visit   Medications  ipratropium-albuterol (DUONEB) 0.5-2.5 (3) MG/3ML nebulizer solution 3 mL (3 mLs Nebulization Given 01/06/17 1853)    BP (!) 152/92 (BP Location: Left Arm)   Pulse 86   Temp 98.8 F (37.1 C) (Oral)   Resp 18   Ht  (1.956 m)   Wt (!) 360 lb (163.3 kg)   SpO2 96%   BMI 42.69 kg/m  No data found.   Physical Exam  Constitutional: He is oriented to person, place, and time. He appears well-developed and well-nourished. No distress.  HENT:  Head: Normocephalic and atraumatic.  Right Ear: External ear normal.  Left Ear: External ear normal.  Nose: Nose normal.  Mouth/Throat: Oropharynx is clear and moist. No oropharyngeal exudate.  Eyes: Pupils are equal, round, and reactive to light.  Neck: Normal range of motion.  Pulmonary/Chest: Effort normal. No respiratory distress. He has no wheezes. He has no rales.  Patient has decreased air movement but there are no wheezing, rales or  rhonchi present.  Musculoskeletal: Normal range of motion.  Neurological: He is alert and oriented to person, place, and time.  Skin: Skin is warm and dry. He is not diaphoretic.  Psychiatric: He has a normal mood and affect. His behavior is normal. Judgment and thought content normal.  Nursing note and vitals reviewed.   Urgent Care Course     Procedures (including critical care time)  Labs Review Labs Reviewed - No data to display  Imaging Review No results found.   Visual Acuity Review  Right Eye Distance:   Left Eye Distance:   Bilateral  Distance:    Right Eye Near:   Left Eye Near:    Bilateral Near:     Medications  ipratropium-albuterol (DUONEB) 0.5-2.5 (3) MG/3ML nebulizer solution 3 mL (3 mLs Nebulization Given 01/06/17 1853)  Patient reports much improvement following the nebulizer treatment    MDM   1. Mild intermittent asthma with exacerbation    Discharge Medication List as of 01/06/2017  7:12 PM    START taking these medications   Details  fluticasone (FLONASE) 50 MCG/ACT nasal spray Place 2 sprays into both nostrils daily., Starting Tue 01/06/2017, Normal    predniSONE (DELTASONE) 20 MG tablet Take 2 tablets (40 mg) daily by mouth, Normal      Plan: 1. Test/x-ray results and diagnosis reviewed with patient 2. rx as per orders; risks, benefits, potential side effects reviewed with patient 3. Recommend supportive treatment with Use of albuterol when necessary. He was given refills until he finds a new physician in the area. He has worsening of his symptoms he should go immediately to the emergency room. 4. F/u prn if symptoms worsen or don't improve     Lutricia Feil, PA-C 01/06/17 1939

## 2017-01-06 NOTE — ED Triage Notes (Signed)
Patient c/o asthma / allergies. Per patient nasal congestion.

## 2017-03-28 ENCOUNTER — Encounter (HOSPITAL_COMMUNITY): Payer: Self-pay | Admitting: Emergency Medicine

## 2017-03-28 ENCOUNTER — Emergency Department (HOSPITAL_COMMUNITY): Payer: Medicaid Other

## 2017-03-28 ENCOUNTER — Emergency Department (HOSPITAL_COMMUNITY)
Admission: EM | Admit: 2017-03-28 | Discharge: 2017-03-28 | Disposition: A | Discharge status: Home / Self Care | Payer: Medicaid Other | Attending: Emergency Medicine | Admitting: Emergency Medicine

## 2017-03-28 DIAGNOSIS — J45909 Unspecified asthma, uncomplicated: Secondary | ICD-10-CM | POA: Diagnosis not present

## 2017-03-28 DIAGNOSIS — R079 Chest pain, unspecified: Secondary | ICD-10-CM | POA: Insufficient documentation

## 2017-03-28 DIAGNOSIS — G8929 Other chronic pain: Secondary | ICD-10-CM

## 2017-03-28 DIAGNOSIS — M791 Myalgia, unspecified site: Secondary | ICD-10-CM

## 2017-03-28 NOTE — ED Triage Notes (Addendum)
Pt states he has been "not feeling well" for over 3 weeks. States he thought it was a sinus infection. Generalized body aches and weakness.  Pt states he has fallen a few times due to his "legs giving out".  Pt states he feels his "throat swelling" at night and is afraid to go to sleep. Pt does have a history of asthma but states this doesn't feel like he does when his asthma is exacerbated.

## 2017-03-28 NOTE — ED Provider Notes (Signed)
[facility]-EMERGENCY DEPT Provider Note   CSN: [id number] Arrival date & time: 03/28/17  0306     History   Chief Complaint Chief Complaint  Patient presents with  . Illness    HPI Christopher Luna is a 31 y.o. male.  Patient presents with a primary complaint of chest pain. He states he has had this pain before and that it is constant, but was concerned when it became sharper and more intense last night. No SOB, nausea or vomiting. He is also having symptoms of sinus pressure and generalized body aches with significant fatigue that also started yesterday. No fever. He reports history of asthma but he does not feel his chest discomfort is related to this.    The history is provided by the patient. No language interpreter was used.  Illness  Associated symptoms include chest pain. Pertinent negatives include no abdominal pain, no headaches and no shortness of breath.    Past Medical History:  Diagnosis Date  . Asthma   . Environmental allergies   . History of echocardiogram    a. Echo 6/17: EF 60-65%, no RWMA, diastolic function normal, mod LAE  . OSA (obstructive sleep apnea)    on CPAP    Patient Active Problem List   Diagnosis Date Noted  . Chest pain 03/05/2016  . SOB (shortness of breath) 03/05/2016  . Abnormal EKG 03/05/2016    Past Surgical History:  Procedure Laterality Date  . ARTHROSCOPIC REPAIR ACL Right 2005   x2       Home Medications    Prior to Admission medications   Medication Sig Start Date End Date Taking? Authorizing Provider  albuterol (PROVENTIL HFA;VENTOLIN HFA) 108 (90 Base) MCG/ACT inhaler Inhale 1-2 puffs into the lungs every 6 (six) hours as needed for wheezing or shortness of breath. Use with spacer 01/06/17   Lutricia Feil, PA-C  fluticasone (FLONASE) 50 MCG/ACT nasal spray Place 2 sprays into both nostrils daily. 01/06/17   Lutricia Feil, PA-C  predniSONE (DELTASONE) 20 MG tablet Take 2 tablets (40 mg) daily by mouth 01/06/17   Lutricia Feil, PA-C    Family History Family History  Problem Relation Age of Onset  . Hypertension Mother   . Hypertension Father   . Asthma Neg Hx     Social History Social History  Substance Use Topics  . Smoking status: Never Smoker  . Smokeless tobacco: Never Used  . Alcohol use Yes     Comment: social     Allergies   Patient has no known allergies.   Review of Systems Review of Systems  Constitutional: Positive for fatigue. Negative for chills and fever.  HENT: Positive for sinus pressure. Negative for facial swelling and sore throat.   Respiratory: Positive for cough. Negative for shortness of breath.   Cardiovascular: Positive for chest pain.  Gastrointestinal: Negative.  Negative for abdominal pain and nausea.  Musculoskeletal: Negative.   Skin: Negative.   Neurological: Negative.  Negative for headaches.     Physical Exam Updated Vital Signs BP (!) 164/106 (BP Location: Right Arm)   Pulse 92   Temp 97.6 F (36.4 C) (Oral)   Resp 18   Ht 6\' 4"  (1.93 m)   Wt (!) 165.6 kg (365 lb)   SpO2 97%   BMI 44.43 kg/m   Physical Exam  Constitutional: He appears well-developed and well-nourished.  HENT:  Head: Normocephalic.  Nose: Right sinus exhibits maxillary sinus tenderness. Left sinus exhibits maxillary sinus tenderness.  Neck:  Normal range of motion. Neck supple.  Cardiovascular: Normal rate and regular rhythm.   Pulmonary/Chest: Effort normal and breath sounds normal. He has no wheezes. He has no rales. He exhibits no tenderness.  Abdominal: Soft. Bowel sounds are normal. There is no tenderness. There is no rebound and no guarding.  Musculoskeletal: Normal range of motion.  Neurological: He is alert. No cranial nerve deficit.  Skin: Skin is warm and dry. No rash noted.  Psychiatric: He has a normal mood and affect.     ED Treatments / Results  Labs (all labs ordered are listed, but only abnormal results are displayed) Labs Reviewed - No data to  display  EKG  EKG Interpretation  Date/Time:  Saturday March 28 2017 04:51:08 EDT Ventricular Rate:  80 PR Interval:    QRS Duration: 108 QT Interval:  374 QTC Calculation: 432 R Axis:   46 Text Interpretation:  Sinus rhythm Probable left ventricular hypertrophy ST elev, probable normal early repol pattern No significant change since last tracing Confirmed by Zadie RhineWickline, Donald ([id number]) on 03/28/2017 4:58:14 AM       Radiology Dg Chest 2 View  Result Date: 03/28/2017 CLINICAL DATA:  Initial evaluation for acute chest pain. EXAM: CHEST  2 VIEW COMPARISON:  Prior radiograph from 03/04/2016. FINDINGS: The cardiac and mediastinal silhouettes are stable in size and contour, and remain within normal limits. The lungs are normally inflated. No airspace consolidation, pleural effusion, or pulmonary edema is identified. There is no pneumothorax. No acute osseous abnormality identified. IMPRESSION: No active cardiopulmonary disease. Electronically Signed   By: Rise MuBenjamin  McClintock M.D.   On: 03/28/2017 04:29    Procedures Procedures (including critical care time)  Medications Ordered in ED Medications - No data to display   Initial Impression / Assessment and Plan / ED Course  I have reviewed the triage vital signs and the nursing notes.  Pertinent labs & imaging results that were available during my care of the patient were reviewed by me and considered in my medical decision making (see chart for details).     Patient presents with concern for chest pain. Chart reviewed. He has a history of chest pain. ECHO 02/2016 showed normal EF, normal diastolic function, no effusion. CP not felt to be cardiac etiology. ETT 02/2016 was also performed and was "suboptimal" and patient refused myoview.   EKG unremarkable. CXR clear. Suspect patient has ongoing, noncardiac chest pain with new symptoms of generalized body aches and sinus pressure likely of viral etiology without fever or significant exam  finding.     Final Clinical Impressions(s) / ED Diagnoses   Final diagnoses:  None   1. Nonspecific chest pain, chronic 2. Myalgias   New Prescriptions New Prescriptions   No medications on file     Elpidio AnisUpstill, Abena Erdman, Cordelia Poche-C 03/28/17 [id number]    Zadie RhineWickline, Donald, MD 03/28/17 [phone]

## 2017-03-28 NOTE — Discharge Instructions (Signed)
Follow up with your primary care doctor or [facility] for further management of your chest pain and current symptoms of body aches and fatigue. Recommend ibuprofen for aches. Push fluids.

## 2017-04-16 ENCOUNTER — Ambulatory Visit (HOSPITAL_COMMUNITY)
Admission: EM | Admit: 2017-04-16 | Discharge: 2017-04-16 | Disposition: A | Discharge status: Home / Self Care | Payer: Medicaid Other | Attending: Emergency Medicine | Admitting: Emergency Medicine

## 2017-04-16 ENCOUNTER — Encounter (HOSPITAL_COMMUNITY): Payer: Self-pay

## 2017-04-16 DIAGNOSIS — Z79899 Other long term (current) drug therapy: Secondary | ICD-10-CM | POA: Diagnosis not present

## 2017-04-16 DIAGNOSIS — Z825 Family history of asthma and other chronic lower respiratory diseases: Secondary | ICD-10-CM | POA: Diagnosis not present

## 2017-04-16 DIAGNOSIS — J45909 Unspecified asthma, uncomplicated: Secondary | ICD-10-CM | POA: Insufficient documentation

## 2017-04-16 DIAGNOSIS — Z8249 Family history of ischemic heart disease and other diseases of the circulatory system: Secondary | ICD-10-CM | POA: Insufficient documentation

## 2017-04-16 DIAGNOSIS — J0110 Acute frontal sinusitis, unspecified: Secondary | ICD-10-CM

## 2017-04-16 DIAGNOSIS — Z9889 Other specified postprocedural states: Secondary | ICD-10-CM | POA: Insufficient documentation

## 2017-04-16 DIAGNOSIS — J029 Acute pharyngitis, unspecified: Secondary | ICD-10-CM | POA: Diagnosis not present

## 2017-04-16 LAB — POCT RAPID STREP A: Streptococcus, Group A Screen (Direct): NEGATIVE

## 2017-04-16 MED ORDER — CLINDAMYCIN HCL 300 MG PO CAPS: 300.0000 mg | ORAL_CAPSULE | Freq: Three times a day (TID) | ORAL | 0 refills | Status: DC

## 2017-04-16 MED ORDER — CEFTRIAXONE SODIUM 1 G IJ SOLR
INTRAMUSCULAR | Status: AC
Start: 2017-04-16 — End: 2017-04-16
  Filled 2017-04-16: qty 10

## 2017-04-16 MED ORDER — PREDNISONE 10 MG (21) PO TBPK: ORAL_TABLET | ORAL | 0 refills | Status: DC

## 2017-04-16 MED ORDER — LIDOCAINE HCL (PF) 1 % IJ SOLN
INTRAMUSCULAR | Status: AC
  Filled 2017-04-16: qty 2

## 2017-04-16 MED ORDER — CEFTRIAXONE SODIUM 1 G IJ SOLR
1.0000 g | Freq: Once | INTRAMUSCULAR | Status: AC
  Administered 2017-04-16: 1 g via INTRAMUSCULAR

## 2017-04-16 NOTE — ED Provider Notes (Signed)
CSN: [id number]     Arrival date & time 04/16/17  1842 History   None    Chief Complaint  Patient presents with  . Sore Throat   (Consider location/radiation/quality/duration/timing/severity/associated sxs/prior Treatment) 31 year old male presents to clinic for evaluation of sore throat ongoing for 2 weeks, along with bloody mucus, and swelling in the face. Was evaluated earlier in the emergency department, states he was not treated for anything there. He provided photographs of his mucus, he does appear quite bloody. Initially had fever the first week, this is since resolved, however he has had worsening pain in his throat, states it worse when he swallows, and started noticing a change in his voice today. Denies any cough, fatigue, nausea, vomiting, or other systemic symptoms.   The history is provided by the patient.  Sore Throat     Past Medical History:  Diagnosis Date  . Asthma   . Environmental allergies   . History of echocardiogram    a. Echo 6/17: EF 60-65%, no RWMA, diastolic function normal, mod LAE  . OSA (obstructive sleep apnea)    on CPAP   Past Surgical History:  Procedure Laterality Date  . ARTHROSCOPIC REPAIR ACL Right 2005   x2   Family History  Problem Relation Age of Onset  . Hypertension Mother   . Hypertension Father   . Asthma Neg Hx    Social History  Substance Use Topics  . Smoking status: Never Smoker  . Smokeless tobacco: Never Used  . Alcohol use Yes     Comment: social    Review of Systems  Constitutional: Negative.   HENT: Positive for congestion, facial swelling, sinus pain, sore throat and voice change. Negative for rhinorrhea and trouble swallowing.   Respiratory: Negative.   Cardiovascular: Negative.   Gastrointestinal: Negative.   Musculoskeletal: Negative.   Neurological: Negative.     Allergies  Patient has no known allergies.  Home Medications   Prior to Admission medications   Medication Sig Start Date End Date  Taking? Authorizing Provider  albuterol (PROVENTIL HFA;VENTOLIN HFA) 108 (90 Base) MCG/ACT inhaler Inhale 1-2 puffs into the lungs every 6 (six) hours as needed for wheezing or shortness of breath. Use with spacer 01/06/17  Yes Lutricia Feiloemer, William P, PA-C  clindamycin (CLEOCIN) 300 MG capsule Take 1 capsule (300 mg total) by mouth 3 (three) times daily. 04/16/17   Dorena BodoKennard, Xiao Graul, NP  fluticasone (FLONASE) 50 MCG/ACT nasal spray Place 2 sprays into both nostrils daily. 01/06/17   Lutricia Feiloemer, William P, PA-C  predniSONE (STERAPRED UNI-PAK 21 TAB) 10 MG (21) TBPK tablet Take 6 tablets tomorrow, decrease by 1 each day till finished (6,5,4,3,2,1) 04/16/17   Dorena BodoKennard, Jamani Bearce, NP   Meds Ordered and Administered this Visit   Medications  cefTRIAXone (ROCEPHIN) injection 1 g (1 g Intramuscular Given 04/16/17 2003)    BP (!) 160/111 (BP Location: Right Arm)   Pulse 85   Temp 98.4 F (36.9 C) (Oral)   Resp 14   Ht 6\' 4"  (1.93 m)   Wt (!) 365 lb (165.6 kg)   SpO2 97%   BMI 44.43 kg/m  No data found.   Physical Exam  Constitutional: He is oriented to person, place, and time. He appears well-developed and well-nourished. No distress.  HENT:  Head: Normocephalic and atraumatic.  Right Ear: Tympanic membrane and external ear normal.  Left Ear: Tympanic membrane and external ear normal.  Nose: No mucosal edema or rhinorrhea. Right sinus exhibits frontal sinus tenderness. Left sinus exhibits  frontal sinus tenderness.  Mouth/Throat: Uvula is midline and oropharynx is clear and moist. No oral lesions. No trismus in the jaw. No dental abscesses. No posterior oropharyngeal edema, posterior oropharyngeal erythema or tonsillar abscesses. Tonsils are 2+ on the right. Tonsils are 2+ on the left. No tonsillar exudate.  Eyes: Conjunctivae are normal.  Neck: Normal range of motion.  Cardiovascular: Normal rate and regular rhythm.   Pulmonary/Chest: Effort normal and breath sounds normal.  Lymphadenopathy:       Head  (right side): Submandibular and tonsillar adenopathy present. No preauricular adenopathy present.       Head (left side): Submandibular and tonsillar adenopathy present.    He has cervical adenopathy.  Neurological: He is alert and oriented to person, place, and time.  Skin: Skin is warm and dry. Capillary refill takes less than 2 seconds. He is not diaphoretic.  Psychiatric: He has a normal mood and affect. His behavior is normal.  Nursing note and vitals reviewed.   Urgent Care Course     Procedures (including critical care time)  Labs Review Labs Reviewed  POCT RAPID STREP A    Imaging Review No results found.   MDM   1. Acute frontal sinusitis, recurrence not specified     Concern for infection. Treating with rocephin in clinic and starting on Clindamycin along with prednisone taper. If symptoms worsen, return to clinic or go to the ER.    Dorena BodoKennard, Junnie Loschiavo, NP 04/16/17 2020

## 2017-04-16 NOTE — ED Triage Notes (Signed)
Pt. Here for sore throat recurrent for a few months. This episode has been going on for over a week. Pt. Feels like his throat is closing up and that there's something in it. Pt. Coughs to try to relieve this feeling and has even coughed up a big a blood from the strong coughs. Pt. States this all started when he had a sinus infection over a month ago.

## 2017-04-16 NOTE — Discharge Instructions (Signed)
For your infection I have ordered a shot of rocephin and started you on Clindamycin and a prednisone dose pack. If symptoms worsen, consider the ER or return to clinic as needed.

## 2017-04-18 ENCOUNTER — Emergency Department (HOSPITAL_COMMUNITY): Payer: Medicaid Other

## 2017-04-18 ENCOUNTER — Encounter (HOSPITAL_COMMUNITY): Payer: Self-pay | Admitting: Emergency Medicine

## 2017-04-18 ENCOUNTER — Emergency Department (HOSPITAL_COMMUNITY)
Admission: EM | Admit: 2017-04-18 | Discharge: 2017-04-18 | Disposition: A | Discharge status: Home / Self Care | Payer: Medicaid Other | Attending: Physician Assistant | Admitting: Physician Assistant

## 2017-04-18 DIAGNOSIS — Y939 Activity, unspecified: Secondary | ICD-10-CM | POA: Insufficient documentation

## 2017-04-18 DIAGNOSIS — J45909 Unspecified asthma, uncomplicated: Secondary | ICD-10-CM | POA: Diagnosis not present

## 2017-04-18 DIAGNOSIS — Y92410 Unspecified street and highway as the place of occurrence of the external cause: Secondary | ICD-10-CM | POA: Insufficient documentation

## 2017-04-18 DIAGNOSIS — Y999 Unspecified external cause status: Secondary | ICD-10-CM | POA: Insufficient documentation

## 2017-04-18 DIAGNOSIS — Z79899 Other long term (current) drug therapy: Secondary | ICD-10-CM | POA: Insufficient documentation

## 2017-04-18 DIAGNOSIS — G501 Atypical facial pain: Secondary | ICD-10-CM | POA: Insufficient documentation

## 2017-04-18 DIAGNOSIS — R519 Headache, unspecified: Secondary | ICD-10-CM

## 2017-04-18 DIAGNOSIS — R51 Headache: Secondary | ICD-10-CM

## 2017-04-18 MED ORDER — CYCLOBENZAPRINE HCL 10 MG PO TABS: 10.0000 mg | ORAL_TABLET | Freq: Two times a day (BID) | ORAL | 0 refills | Status: DC | PRN
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
## 2017-04-19 LAB — CULTURE, GROUP A STREP (THRC)

## 2017-07-07 ENCOUNTER — Emergency Department (HOSPITAL_COMMUNITY)
Admission: EM | Admit: 2017-07-07 | Discharge: 2017-07-07 | Disposition: A | Discharge status: Home / Self Care | Payer: Medicaid Other | Attending: Emergency Medicine | Admitting: Emergency Medicine

## 2017-07-07 ENCOUNTER — Emergency Department (HOSPITAL_COMMUNITY): Payer: Medicaid Other

## 2017-07-07 ENCOUNTER — Encounter (HOSPITAL_COMMUNITY): Payer: Self-pay

## 2017-07-07 DIAGNOSIS — R0981 Nasal congestion: Secondary | ICD-10-CM | POA: Insufficient documentation

## 2017-07-07 DIAGNOSIS — R0789 Other chest pain: Secondary | ICD-10-CM

## 2017-07-07 DIAGNOSIS — Z79899 Other long term (current) drug therapy: Secondary | ICD-10-CM | POA: Diagnosis not present

## 2017-07-07 DIAGNOSIS — J45909 Unspecified asthma, uncomplicated: Secondary | ICD-10-CM | POA: Diagnosis not present

## 2017-07-07 DIAGNOSIS — R079 Chest pain, unspecified: Secondary | ICD-10-CM | POA: Diagnosis present

## 2017-07-07 LAB — I-STAT TROPONIN, ED
TROPONIN I, POC: 0 ng/mL (ref 0.00–0.08)
Troponin i, poc: 0 ng/mL (ref 0.00–0.08)

## 2017-07-07 MED ORDER — OMEPRAZOLE 20 MG PO CPDR: 20.0000 mg | DELAYED_RELEASE_CAPSULE | Freq: Every day | ORAL | 0 refills | Status: DC

## 2017-07-07 MED ORDER — NAPROXEN 375 MG PO TABS: 375.0000 mg | ORAL_TABLET | Freq: Two times a day (BID) | ORAL | 0 refills | Status: DC

## 2017-07-07 MED ORDER — GI COCKTAIL ~~LOC~~
30.0000 mL | Freq: Once | ORAL | Status: AC
  Administered 2017-07-07: 30 mL via ORAL
  Filled 2017-07-07: qty 30

## 2017-07-07 NOTE — ED Triage Notes (Signed)
Patient complains of 1 day of chest tightness and congestion. Pain worse with inspiration and movement, NAD

## 2017-07-07 NOTE — Discharge Instructions (Signed)
Your lab work and imaging has been reassuring. Unknown cause of your symptoms. Possibilities include acid reflux which I have given you a prescription for Prilosec to take once a day avoid spicy foods. Other possibilities include musculoskeletal pain and pericarditis. Please take the anti-inflammatories as prescribed. It is important that she follow-up with a primary care doctor. If her symptoms worsen including worsening shortness of breath, worsening pain or fever return to the ED immediately.

## 2017-07-07 NOTE — ED Provider Notes (Signed)
Care assumed from previous provider PA Kirichenko. Please see their note for further details to include full history and physical. To summarize in short pt is a 31 year old African-American male presents to the ED complaining of chest pain. Patient reports central chest pain that does not radiate. Pain is worse with sitting up and movement. Better when holding still. . Case discussed, plan agreed upon.     At time of care handoff was awaiting repeat troponin. Patient has a low heart score. Delta troponin is negative. EKG shows no ischemic changes or signs of pericarditis. On reexamination patient is sleeping in the room and snoring. Possible etiologies include acid reflux, musculoskeletal pain,pericarditis, sleep apnea. Will start patient on NSAIDs and Prilosec. Encouraged PCP follow-up. Discussed strict return precautions.  Pt is hemodynamically stable, in NAD, & able to ambulate in the ED. Evaluation does not show pathology that would require ongoing emergent intervention or inpatient treatment. I explained the diagnosis to the patient. Pain has been managed & has no complaints prior to dc. Pt is comfortable with above plan and is stable for discharge at this time. All questions were answered prior to disposition. Strict return precautions for f/u to the ED were discussed. Encouraged follow up with PCP.      Rise Mu, PA-C 07/07/17 1742    Rolland Porter, MD 07/08/17 [phone]
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
## 2017-08-18 DIAGNOSIS — K219 Gastro-esophageal reflux disease without esophagitis: Secondary | ICD-10-CM | POA: Insufficient documentation

## 2017-09-13 ENCOUNTER — Other Ambulatory Visit: Payer: Self-pay

## 2017-09-13 ENCOUNTER — Emergency Department (HOSPITAL_COMMUNITY)
Admission: EM | Admit: 2017-09-13 | Discharge: 2017-09-13 | Disposition: A | Discharge status: Home / Self Care | Payer: Medicaid Other | Attending: Emergency Medicine | Admitting: Emergency Medicine

## 2017-09-13 ENCOUNTER — Encounter (HOSPITAL_COMMUNITY): Payer: Self-pay

## 2017-09-13 DIAGNOSIS — M545 Low back pain, unspecified: Secondary | ICD-10-CM

## 2017-09-13 DIAGNOSIS — Z79899 Other long term (current) drug therapy: Secondary | ICD-10-CM | POA: Diagnosis not present

## 2017-09-13 DIAGNOSIS — G8929 Other chronic pain: Secondary | ICD-10-CM | POA: Insufficient documentation

## 2017-09-13 DIAGNOSIS — J45909 Unspecified asthma, uncomplicated: Secondary | ICD-10-CM | POA: Insufficient documentation

## 2017-09-13 MED ORDER — CYCLOBENZAPRINE HCL 5 MG PO TABS: 5.0000 mg | ORAL_TABLET | Freq: Two times a day (BID) | ORAL | 0 refills | Status: DC | PRN
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
## 2017-10-09 ENCOUNTER — Other Ambulatory Visit: Payer: Self-pay

## 2017-10-09 ENCOUNTER — Emergency Department: Payer: Medicaid Other

## 2017-10-09 ENCOUNTER — Encounter: Payer: Self-pay | Admitting: *Deleted

## 2017-10-09 ENCOUNTER — Emergency Department
Admission: EM | Admit: 2017-10-09 | Discharge: 2017-10-09 | Disposition: A | Discharge status: Home / Self Care | Payer: Medicaid Other | Attending: Emergency Medicine | Admitting: Emergency Medicine

## 2017-10-09 DIAGNOSIS — R079 Chest pain, unspecified: Secondary | ICD-10-CM

## 2017-10-09 DIAGNOSIS — R002 Palpitations: Secondary | ICD-10-CM | POA: Insufficient documentation

## 2017-10-09 DIAGNOSIS — Z79899 Other long term (current) drug therapy: Secondary | ICD-10-CM | POA: Insufficient documentation

## 2017-10-09 DIAGNOSIS — J45909 Unspecified asthma, uncomplicated: Secondary | ICD-10-CM | POA: Diagnosis not present

## 2017-10-09 DIAGNOSIS — R109 Unspecified abdominal pain: Secondary | ICD-10-CM | POA: Insufficient documentation

## 2017-10-09 LAB — BASIC METABOLIC PANEL
Anion gap: 8 (ref 5–15)
BUN: 13 mg/dL (ref 6–20)
CO2: 30 mmol/L (ref 22–32)
CREATININE: 1.08 mg/dL (ref 0.61–1.24)
Calcium: 8.3 mg/dL — ABNORMAL LOW (ref 8.9–10.3)
Chloride: 100 mmol/L — ABNORMAL LOW (ref 101–111)
GFR calc Af Amer: >60 mL/min (ref >60)
GFR calc non Af Amer: >60 mL/min (ref >60)
GLUCOSE: 90 mg/dL (ref 65–99)
POTASSIUM: 3.8 mmol/L (ref 3.5–5.1)
Sodium: 138 mmol/L (ref 135–145)

## 2017-10-09 LAB — URINALYSIS, COMPLETE (UACMP) WITH MICROSCOPIC
BACTERIA UA: NONE SEEN
Bilirubin Urine: NEGATIVE
Glucose, UA: NEGATIVE mg/dL
Hgb urine dipstick: NEGATIVE
KETONES UR: NEGATIVE mg/dL
Leukocytes, UA: NEGATIVE
Nitrite: NEGATIVE
PH: 8 (ref 5.0–8.0)
Protein, ur: NEGATIVE mg/dL
SQUAMOUS EPITHELIAL / LPF: NONE SEEN
Specific Gravity, Urine: 1.005 (ref 1.005–1.030)

## 2017-10-09 LAB — TROPONIN I
Troponin I: <0.03 ng/mL (ref <0.03)
Troponin I: 0.03 ng/mL (ref <0.03)

## 2017-10-09 LAB — HEPATIC FUNCTION PANEL
ALK PHOS: 63 U/L (ref 38–126)
ALT: 31 U/L (ref 17–63)
AST: 32 U/L (ref 15–41)
Albumin: 4 g/dL (ref 3.5–5.0)
BILIRUBIN DIRECT: 0.1 mg/dL (ref 0.1–0.5)
BILIRUBIN INDIRECT: 0.9 mg/dL (ref 0.3–0.9)
Total Bilirubin: 1 mg/dL (ref 0.3–1.2)
Total Protein: 7.4 g/dL (ref 6.5–8.1)

## 2017-10-09 LAB — URINE DRUG SCREEN, QUALITATIVE (ARMC ONLY)
AMPHETAMINES, UR SCREEN: NOT DETECTED
BENZODIAZEPINE, UR SCRN: NOT DETECTED
Barbiturates, Ur Screen: NOT DETECTED
Cannabinoid 50 Ng, Ur ~~LOC~~: NOT DETECTED
Cocaine Metabolite,Ur ~~LOC~~: NOT DETECTED
MDMA (Ecstasy)Ur Screen: NOT DETECTED
METHADONE SCREEN, URINE: NOT DETECTED
OPIATE, UR SCREEN: NOT DETECTED
Phencyclidine (PCP) Ur S: NOT DETECTED
Tricyclic, Ur Screen: NOT DETECTED

## 2017-10-09 LAB — CBC
HEMATOCRIT: 44.2 % (ref 40.0–52.0)
Hemoglobin: 14.9 g/dL (ref 13.0–18.0)
MCH: 28.7 pg (ref 26.0–34.0)
MCHC: 33.6 g/dL (ref 32.0–36.0)
MCV: 85.2 fL (ref 80.0–100.0)
Platelets: 255 10*3/uL (ref 150–440)
RBC: 5.19 MIL/uL (ref 4.40–5.90)
RDW: 13.9 % (ref 11.5–14.5)
WBC: 6.4 10*3/uL (ref 3.8–10.6)

## 2017-10-09 LAB — LIPASE, BLOOD: LIPASE: 22 U/L (ref 11–51)

## 2017-10-09 LAB — ETHANOL: Alcohol, Ethyl (B): <10 mg/dL (ref <10)

## 2017-10-09 MED ORDER — GI COCKTAIL ~~LOC~~
30.0000 mL | Freq: Once | ORAL | Status: AC
  Administered 2017-10-09: 30 mL via ORAL
  Filled 2017-10-09: qty 30

## 2017-10-09 MED ORDER — SODIUM CHLORIDE 0.9 % IV BOLUS (SEPSIS)
500.0000 mL | Freq: Once | INTRAVENOUS | Status: AC
Start: 2017-10-09 — End: 2017-10-09
  Administered 2017-10-09: 500 mL via INTRAVENOUS

## 2017-10-09 MED ORDER — FAMOTIDINE 20 MG PO TABS: 20.0000 mg | ORAL_TABLET | Freq: Two times a day (BID) | ORAL | 0 refills | Status: DC

## 2017-10-09 NOTE — ED Notes (Signed)
EDP offered further dx testing including CT scan, pt and family refused, requesting follow through with d/c. This RN in to remove post surgical bandage and d/c patient.

## 2017-10-09 NOTE — ED Notes (Signed)
ED Provider at bedside. 

## 2017-10-09 NOTE — ED Notes (Signed)
Pt. Verbalizes understanding of d/c instructions and follow-up. VS stable. Pt. In NAD at time of d/c and denies further concerns regarding this visit. Pt. Stable at the time of departure from the unit, departing unit by the safest and most appropriate manner per that pt condition and limitations with all belongings accounted for. Pt advised to return to the ED at any time for emergent concerns, or for new/worsening symptoms.   

## 2017-10-09 NOTE — ED Triage Notes (Signed)
Pt to ED 2 days after left shoulder surgery reporting abd pain and chest pains that pt reports feeling as though his heart is racing. Pts family denies pt having a fever at home. Pt has noted weakness as well, RN had to help pt out of the car upon arrival. PT reports he had "lung complications" while in surgery but was unable to elaborate.

## 2017-10-09 NOTE — ED Notes (Signed)
Pt given urinal to collect urine sample.

## 2017-10-09 NOTE — ED Notes (Signed)
Called lab per Christopher SetaHeather charge RN due to pt being a difficult stick and unable to obtain 2nd troponin

## 2017-10-09 NOTE — ED Provider Notes (Addendum)
[facility] Emergency Department Provider Note  ____________________________________________   I have reviewed the triage vital signs and the nursing notes. Where available I have reviewed prior notes and, if possible and indicated, outside hospital notes.    HISTORY  Chief Complaint Abdominal Pain and Palpitations    HPI Christopher Luna is a 32 y.o. male with a history of chronic noncardiac chest pain with an echo in 2017 that was reassuring, under stress.  Also history of panic attacks, palpitations, according to him, as well as asthma hypertension morbid obesity sleep apnea recurrent back pain, never visits to emergency departments for chest pain in the past he states, postop day 3 from laparoscopic shoulder labral repair which according to notes seem to go uneventfully although he states "there were some breathing issues".  And apparently had a little trouble weaning off oxygen during the procedure was placed temporarily on CPAP, has had no breathing issues since he went home however and does not complain of shortness of breath at this time, presents today complaining of a burning sensation started his abdomen, and then went towards his chest and now is burning throughout his entire body.  He denies any personal family history of PE any lower upper extremity swelling, his shoulder feels good.  He did not take his blood pressure medication today but he did take his pain medications and he states it makes him sleepy.  Location: Entire body Radiation: Entire body Quality: Burning Duration: This morning woke up with it Timing: Nothing makes it better nothing makes it worse Severity: Mild to moderate Associated sxs: Feels his heart beating hard PriorTreatment : None   Past Medical History:  Diagnosis Date  . Asthma   . Environmental allergies   . History of echocardiogram    a. Echo 6/17: EF 60-65%, no RWMA, diastolic function normal, mod LAE  . OSA (obstructive  sleep apnea)    on CPAP    Patient Active Problem List   Diagnosis Date Noted  . Chest pain 03/05/2016  . SOB (shortness of breath) 03/05/2016  . Abnormal EKG 03/05/2016    Past Surgical History:  Procedure Laterality Date  . ARTHROSCOPIC REPAIR ACL Right 2005   x2    Prior to Admission medications   Medication Sig Start Date End Date Taking? Authorizing Provider  albuterol (PROVENTIL HFA;VENTOLIN HFA) 108 (90 Base) MCG/ACT inhaler Inhale 1-2 puffs into the lungs every 6 (six) hours as needed for wheezing or shortness of breath. Use with spacer 01/06/17   Lutricia Feil, PA-C  clindamycin (CLEOCIN) 300 MG capsule Take 1 capsule (300 mg total) by mouth 3 (three) times daily. 04/16/17   Dorena Bodo, NP  cyclobenzaprine (FLEXERIL) 10 MG tablet Take 1 tablet (10 mg total) by mouth 2 (two) times daily as needed for muscle spasms. 04/18/17   Mackuen, Courteney Lyn, MD  cyclobenzaprine (FLEXERIL) 5 MG tablet Take 1 tablet (5 mg total) by mouth 2 (two) times daily as needed for muscle spasms. 09/13/17   Horton, Mayer Masker, MD  fluticasone (FLONASE) 50 MCG/ACT nasal spray Place 2 sprays into both nostrils daily. 01/06/17   Lutricia Feil, PA-C  naproxen (NAPROSYN) 375 MG tablet Take 1 tablet (375 mg total) by mouth 2 (two) times daily. 07/07/17   Rise Mu, PA-C  omeprazole (PRILOSEC) 20 MG capsule Take 1 capsule (20 mg total) by mouth daily. 07/07/17   Rise Mu, PA-C  predniSONE (STERAPRED UNI-PAK 21 TAB) 10 MG (21) TBPK tablet Take 6  tablets tomorrow, decrease by 1 each day till finished (6,5,4,3,2,1) 04/16/17   Dorena Bodo, NP    Allergies Patient has no known allergies.  Family History  Problem Relation Age of Onset  . Hypertension Mother   . Hypertension Father   . Asthma Neg Hx     Social History Social History   Tobacco Use  . Smoking status: Never Smoker  . Smokeless tobacco: Never Used  Substance Use Topics  . Alcohol use: Yes     Comment: social  . Drug use: No    Review of Systems Constitutional: No fever/chills Eyes: No visual changes. ENT: No sore throat. No stiff neck no neck pain Cardiovascular: See HPI Respiratory: Denies shortness of breath. Gastrointestinal:   no vomiting.  No diarrhea.  No constipation. Genitourinary: Negative for dysuria. Musculoskeletal: Negative lower extremity swelling Skin: Negative for rash. Neurological: Negative for severe headaches, focal weakness or numbness.   ____________________________________________   PHYSICAL EXAM:  VITAL SIGNS: ED Triage Vitals  Enc Vitals Group     BP 10/09/17 1453 (!) 161/96     Pulse Rate 10/09/17 1453 89     Resp 10/09/17 1453 18     Temp 10/09/17 1453 99.5 F (37.5 C)     Temp Source 10/09/17 1453 Oral     SpO2 10/09/17 1453 97 %     Weight 10/09/17 1455 (!) 385 lb (174.6 kg)     Height 10/09/17 1455 6\' 5"  (1.956 m)     Head Circumference --      Peak Flow --      Pain Score 10/09/17 1502 7     Pain Loc --      Pain Edu? --      Excl. in GC? --     Constitutional: Patient is slightly somnolent but awake and alert no acute distress, seems anxious, Eyes: Conjunctivae are normal Head: Atraumatic HEENT: No congestion/rhinnorhea. Mucous membranes are moist.  Oropharynx non-erythematous Neck:   Nontender with no meningismus, no masses, no stridor Cardiovascular: Normal rate, regular rhythm. Grossly normal heart sounds.  Good peripheral circulation. Respiratory: Normal respiratory effort.  No retractions. Lungs CTAB. Abdominal: Soft and obese, there is minimal epigastric discomfort which exactly reproduces the patient's burning discomfort, when I touch this area he states "that the pain right there". No distention. No guarding no rebound, this is a benign nonsurgical abdomen Back:  There is no focal tenderness or step off.  there is no midline tenderness there are no lesions noted. there is no CVA tenderness Musculoskeletal: No lower  extremity tenderness, no upper extremity tenderness. No joint effusions, no DVT signs strong distal pulses no edema surgery site is clean dry and intact with no evidence of infection noted no hot to touch, Neurologic:  Normal speech and language. No gross focal neurologic deficits are appreciated.  Skin:  Skin is warm, dry and intact. No rash noted. Psychiatric: Mood and affect are normal. Speech and behavior are normal.  ____________________________________________   LABS (all labs ordered are listed, but only abnormal results are displayed)  Labs Reviewed  BASIC METABOLIC PANEL - Abnormal; Notable for the following components:      Result Value   Chloride 100 (*)    Calcium 8.3 (*)    All other components within normal limits  CBC  TROPONIN I  HEPATIC FUNCTION PANEL  LIPASE, BLOOD  ETHANOL  URINALYSIS, COMPLETE (UACMP) WITH MICROSCOPIC  URINE DRUG SCREEN, QUALITATIVE ([facility] ONLY)    Pertinent labs  results that  were available during my care of the patient were reviewed by me and considered in my medical decision making (see chart for details). ____________________________________________  EKG  I personally interpreted any EKGs ordered by me or triage Sinus rate 90, no acute ST elevation or depression, ____________________________________________  RADIOLOGY  Pertinent labs & imaging results that were available during my care of the patient were reviewed by me and considered in my medical decision making (see chart for details). If possible, patient and/or family made aware of any abnormal findings.  No results found. ____________________________________________    PROCEDURES  Procedure(s) performed: None  Procedures  Critical Care performed: None  ____________________________________________   INITIAL IMPRESSION / ASSESSMENT AND PLAN / ED COURSE  Pertinent labs & imaging results that were available during my care of the patient were reviewed by me and considered  in my medical decision making (see chart for details).  Patient here with burning discomfort in his epigastric region which goes throughout his entire body and palpitations in the context of a history of panic attacks and recent shoulder surgery.  Very low suspicion that this reproducible burning discomfort in his epigastric region represents a PE, very low suspicion for ACS, patient states discomfort is been there since he woke up this morning and his initial troponin is negative we will recheck that, EKG is reassuring, we will give him antiacids as it does appear the patient has had issues with this in the past, vital signs are reassuring, we will continue to monitor him closely, given upper abdomen discomfort I will check lipase and hepatic function panels.  ----------------------------------------- 4:37 PM on 10/09/2017 -----------------------------------------  GI cocktail patient's pain went away, this is certainly not enough to diagnose him with a stomach pathology but given the fact that he reproducible epigastric abdominal pain which was described as a burning discomfort, that got better with a GI cocktail is certainly reassuring, also reassuring is his EKG, troponin, liver function test chronicity of the pain normal lipase normal CBC etc.  However we will resend a cardiac enzymes to ensure that there is no evidence of ongoing ischemia although again very low suspicion.  The patient actually does not have chest pain he has chronically reproducible burning epigastric abdominal discomfort, low suspicion for PE at this time, there does not appear to be clinical evidence to support the diagnosis of pulmonary embolus, dissection, myocarditis, endocarditis, pericarditis, pericardial tamponade, acute coronary syndrome, pneumothorax, pneumonia, or any other acute intrathoracic pathology that will require admission or acute intervention. Nor is there evidence of any significant intra-abdominal pathology  causing this discomfort.  ----------------------------------------- 9:09 PM on 10/09/2017 -----------------------------------------  She has been asymptomatic here aside from occasional feelings of whole body burning.  Patient is supposed to be on CPAP at night but is not using it because he does not like the way it makes him feels.  He is also taking oxycodone from his surgery.  I suggest to him that perhaps he is feeling a little sleepy today because these 2 things are a bad combination, noncompliance with CPAP and pain medication.  Patient understands this.  In any event, he is wide awake at this time he is in no acute distress his workup is reassuring, troponins 0.03 which is in my opinion negative, there is been no evidence of acute coronary syndrome, I did offer to keep the family for further cardiac enzyme testing and they refuse they are eager to go home I do not think this is unreasonable is a 32 year old  male who has had negative stress test for atypical pain presents with whole body burning, we also discussed CT scan for PE, they would very much prefer to forego the radiation I do not think this is unreasonable.  Very low suspicion for PE even though he had lapper scopic shoulder surgery recently, he has no chest pain and no shortness of breath just a burning sensation there is whole body and epigastric abdominal discomfort.  In any event, patient is discharged she will follow closely with primary care    ____________________________________________   FINAL CLINICAL IMPRESSION(S) / ED DIAGNOSES  Final diagnoses:  Chest pain, unspecified type      This chart was dictated using voice recognition software.  Despite best efforts to proofread,  errors can occur which can change meaning.      Jeanmarie Plant, MD 10/09/17 1559    Jeanmarie Plant, MD 10/09/17 1643    Jeanmarie Plant, MD 10/09/17 2110

## 2017-10-09 NOTE — ED Notes (Signed)
Pt family requesting further evaluation from EDP, MD made aware.

## 2017-11-18 DIAGNOSIS — M5136 Other intervertebral disc degeneration, lumbar region: Secondary | ICD-10-CM | POA: Insufficient documentation

## 2018-01-14 ENCOUNTER — Ambulatory Visit
Admission: EM | Admit: 2018-01-14 | Discharge: 2018-01-14 | Disposition: A | Discharge status: Home / Self Care | Payer: Medicaid Other | Attending: Family Medicine | Admitting: Family Medicine

## 2018-01-14 ENCOUNTER — Encounter: Payer: Self-pay | Admitting: Emergency Medicine

## 2018-01-14 ENCOUNTER — Other Ambulatory Visit: Payer: Self-pay

## 2018-01-14 DIAGNOSIS — R040 Epistaxis: Secondary | ICD-10-CM

## 2018-01-14 DIAGNOSIS — J45901 Unspecified asthma with (acute) exacerbation: Secondary | ICD-10-CM

## 2018-01-14 DIAGNOSIS — R030 Elevated blood-pressure reading, without diagnosis of hypertension: Secondary | ICD-10-CM | POA: Diagnosis not present

## 2018-01-14 DIAGNOSIS — R400 Somnolence: Secondary | ICD-10-CM

## 2018-01-14 DIAGNOSIS — J309 Allergic rhinitis, unspecified: Secondary | ICD-10-CM

## 2018-01-14 MED ORDER — PREDNISONE 20 MG PO TABS: 40.0000 mg | ORAL_TABLET | Freq: Every day | ORAL | 0 refills | Status: DC

## 2018-01-14 MED ORDER — OXYMETAZOLINE HCL 0.05 % NA SOLN
1.0000 | Freq: Once | NASAL | Status: AC
  Administered 2018-01-14: 1 via NASAL
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
## 2018-01-14 NOTE — Discharge Instructions (Addendum)
Take medication as prescribed. Rest. Drink plenty of fluids.  Continue home albuterol inhaler as needed.  Continue home Claritin or Zyrtec.  Use given nasal spray one more time tonight, once tomorrow morning and once tomorrow evening and then stop.   Follow-up with your primary care physician this coming week, as discussed blood pressure needs to be monitored.  Return to Urgent care for new or worsening concerns.

## 2018-01-14 NOTE — ED Triage Notes (Signed)
Patient in today c/o nasal congestion, pressure and pain x 3 days. Patient states he is blowing out yellow and red mucous. Patient denies fever or chills. Patient has tried OTC Claritin.

## 2018-01-27 ENCOUNTER — Encounter (HOSPITAL_COMMUNITY): Payer: Self-pay

## 2018-01-27 ENCOUNTER — Emergency Department (HOSPITAL_COMMUNITY): Payer: Medicaid Other

## 2018-01-27 ENCOUNTER — Other Ambulatory Visit: Payer: Self-pay

## 2018-01-27 ENCOUNTER — Emergency Department (HOSPITAL_COMMUNITY)
Admission: EM | Admit: 2018-01-27 | Discharge: 2018-01-27 | Disposition: A | Discharge status: Home / Self Care | Payer: Medicaid Other | Attending: Emergency Medicine | Admitting: Emergency Medicine

## 2018-01-27 DIAGNOSIS — R51 Headache: Secondary | ICD-10-CM | POA: Diagnosis present

## 2018-01-27 DIAGNOSIS — I10 Essential (primary) hypertension: Secondary | ICD-10-CM | POA: Diagnosis not present

## 2018-01-27 DIAGNOSIS — J45909 Unspecified asthma, uncomplicated: Secondary | ICD-10-CM | POA: Diagnosis not present

## 2018-01-27 DIAGNOSIS — R519 Headache, unspecified: Secondary | ICD-10-CM

## 2018-01-27 DIAGNOSIS — Z79899 Other long term (current) drug therapy: Secondary | ICD-10-CM | POA: Insufficient documentation

## 2018-01-27 MED ORDER — IBUPROFEN 800 MG PO TABS
800.0000 mg | ORAL_TABLET | Freq: Once | ORAL | Status: AC
  Administered 2018-01-27: 800 mg via ORAL
  Filled 2018-01-27: qty 1

## 2018-01-27 NOTE — ED Triage Notes (Signed)
Pt endorses headache x several weeks. Hypertensive in triage and has hx of same, on amlodipine. No neuro deficits.

## 2018-01-27 NOTE — Discharge Instructions (Addendum)
Please read attached information. If you experience any new or worsening signs or symptoms please return to the emergency room for evaluation. Please follow-up with your primary care provider or specialist as discussed.  °
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
## 2018-01-27 NOTE — ED Notes (Signed)
Pt. Stated, The Ibuprofen is working my pain level is a 3/10

## 2018-01-27 NOTE — ED Notes (Signed)
Pt. Asking for something for pain

## 2018-02-20 IMAGING — DX DG CHEST 2V
2 series · 2 of 2 positions shown · non-contrast
Comparison: 03/28/2017

CLINICAL DATA: Chest pressure, shortness of breath and weakness for
couple days, history asthma, sleep apnea

EXAM:
CHEST  2 VIEW

[chest pa]
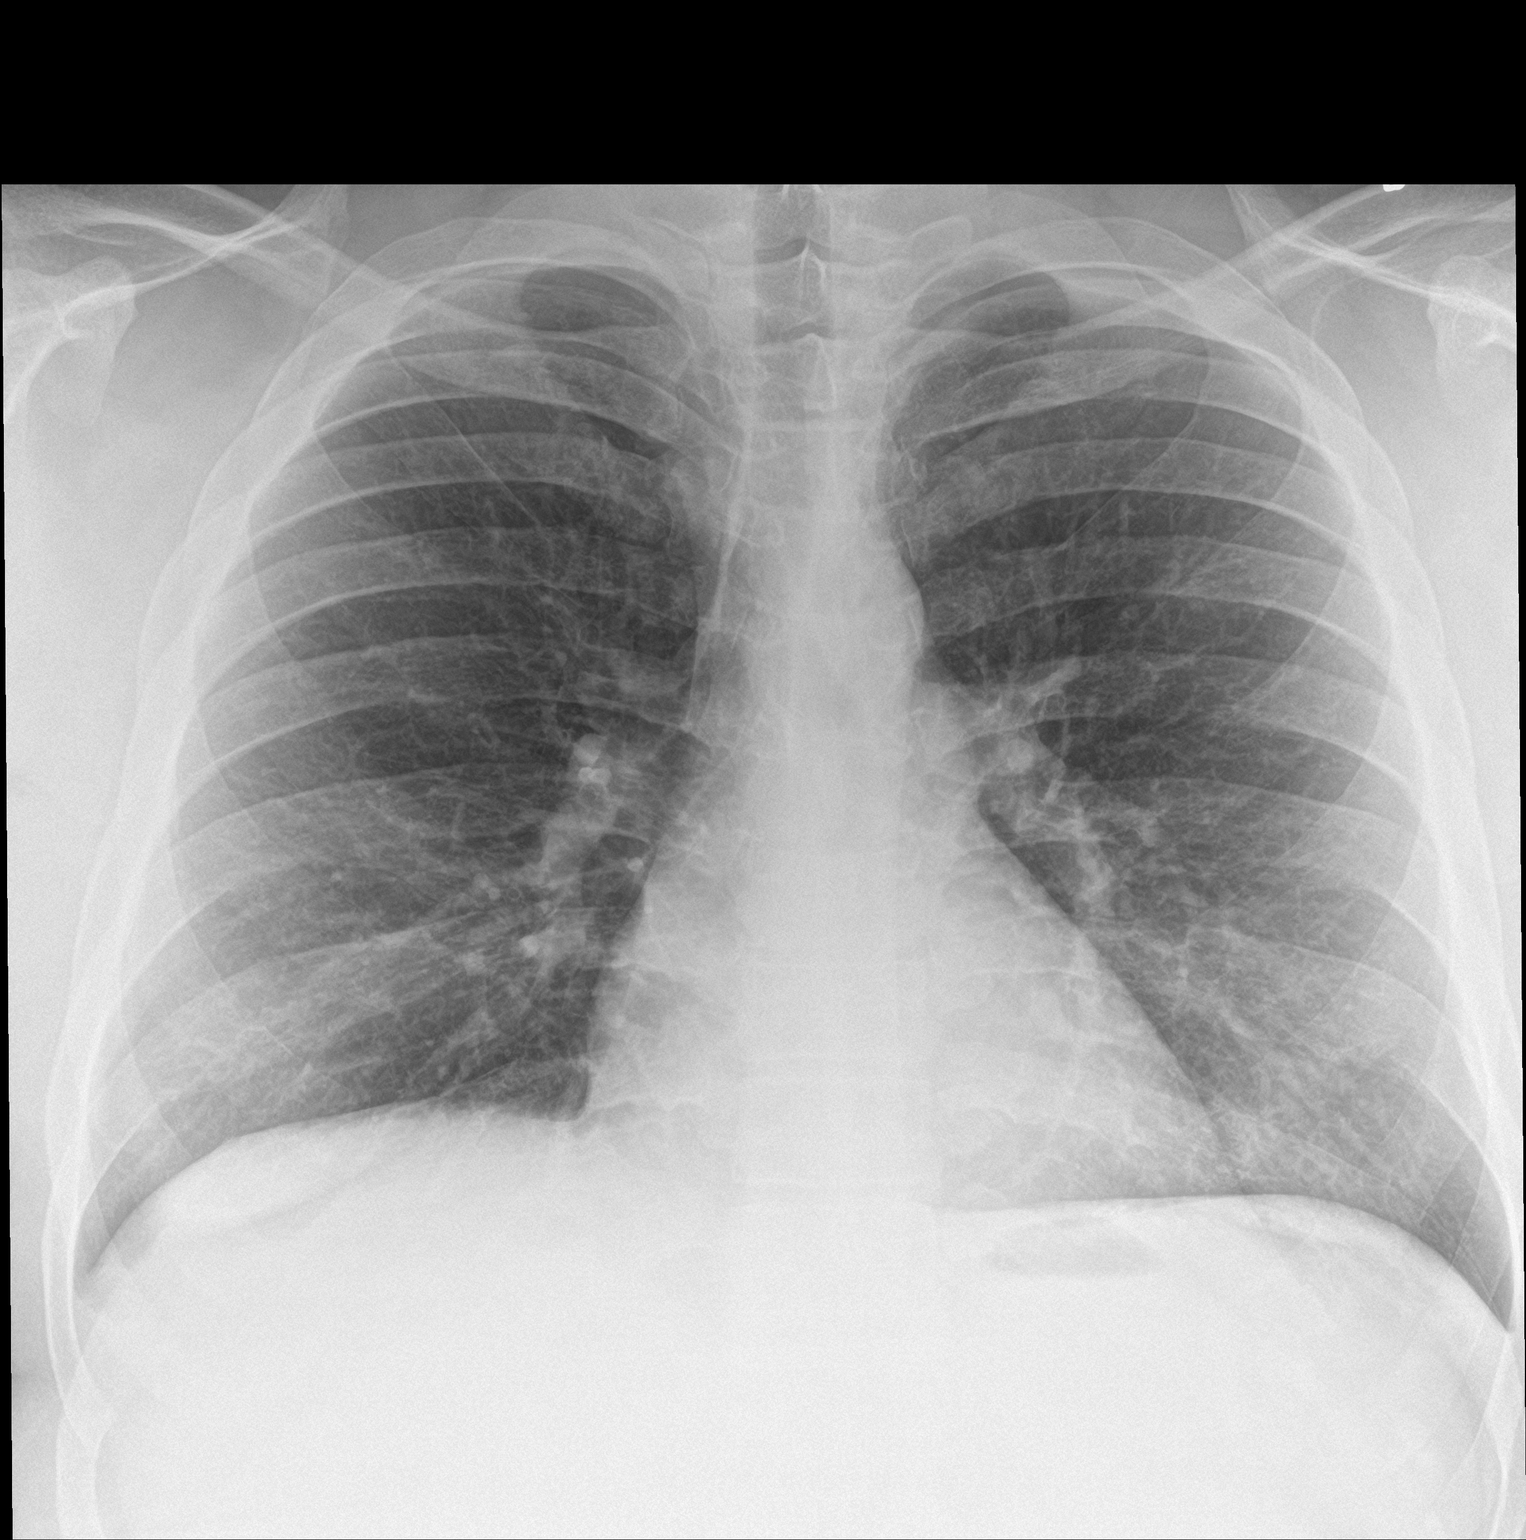

[chest lat]
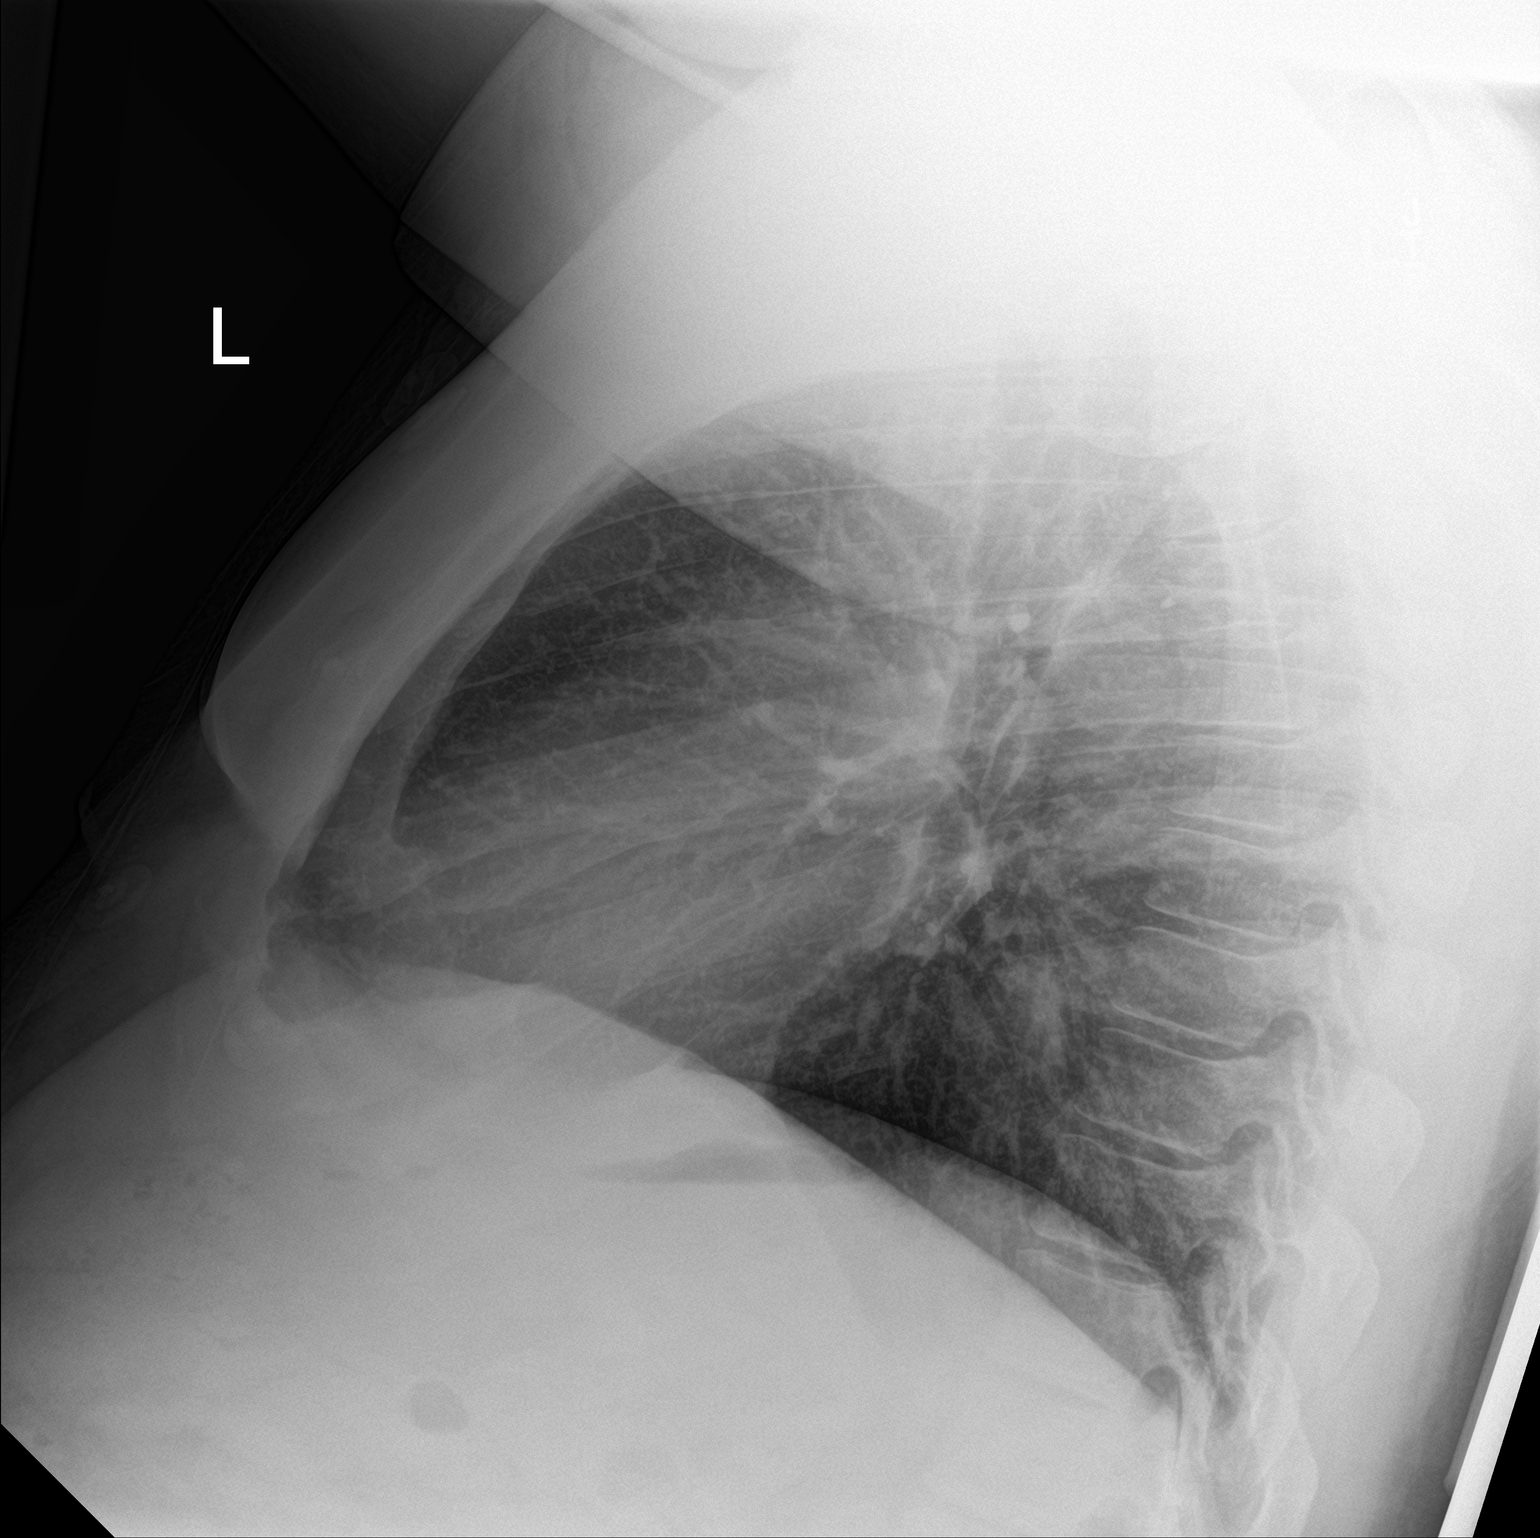

[2 of 2 positions shown; findings below may reference images not displayed]

FINDINGS: Normal heart size, mediastinal contours, and pulmonary vascularity.

Minimal peribronchial thickening.

Lungs otherwise clear.

No pulmonary infiltrate, pleural effusion or pneumothorax.

Bones unremarkable.
IMPRESSION: Minimal peribronchial thickening which could reflect bronchitis or
asthma.

No acute infiltrate.

## 2018-04-10 ENCOUNTER — Encounter: Payer: Self-pay | Admitting: Gynecology

## 2018-04-10 ENCOUNTER — Ambulatory Visit
Admission: EM | Admit: 2018-04-10 | Discharge: 2018-04-10 | Disposition: A | Discharge status: Home / Self Care | Payer: Medicaid Other | Attending: Family Medicine | Admitting: Family Medicine

## 2018-04-10 ENCOUNTER — Other Ambulatory Visit: Payer: Self-pay

## 2018-04-10 DIAGNOSIS — L0102 Bockhart's impetigo: Secondary | ICD-10-CM

## 2018-04-10 DIAGNOSIS — L738 Other specified follicular disorders: Secondary | ICD-10-CM | POA: Diagnosis not present

## 2018-04-10 DIAGNOSIS — G44321 Chronic post-traumatic headache, intractable: Secondary | ICD-10-CM | POA: Diagnosis not present

## 2018-04-10 DIAGNOSIS — R030 Elevated blood-pressure reading, without diagnosis of hypertension: Secondary | ICD-10-CM

## 2018-04-10 DIAGNOSIS — I10 Essential (primary) hypertension: Secondary | ICD-10-CM

## 2018-04-10 MED ORDER — DOXYCYCLINE HYCLATE 100 MG PO CAPS: 100.0000 mg | ORAL_CAPSULE | Freq: Two times a day (BID) | ORAL | 0 refills | Status: AC
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
## 2018-04-10 NOTE — Discharge Instructions (Addendum)
Recommend start Doxycycline 100mg  twice a day as directed. Avoid hair products for the next 7 days. No shaving back of head/neck. May clean area with gentle soap and water. Continue to monitor headache pattern- information provided for contacting [facility] in [place] for appointment for further evaluation of headaches. Continue to monitor blood pressure- see PCP for further management. Follow-up here in 5 to 7 days if rash/folliculitis does not improve.
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
## 2018-04-24 ENCOUNTER — Ambulatory Visit
Admission: EM | Admit: 2018-04-24 | Discharge: 2018-04-24 | Disposition: A | Discharge status: Home / Self Care | Payer: Medicaid Other | Attending: Family Medicine | Admitting: Family Medicine

## 2018-04-24 ENCOUNTER — Encounter: Payer: Self-pay | Admitting: Gynecology

## 2018-04-24 DIAGNOSIS — G44229 Chronic tension-type headache, not intractable: Secondary | ICD-10-CM | POA: Diagnosis not present

## 2018-04-24 DIAGNOSIS — I10 Essential (primary) hypertension: Secondary | ICD-10-CM

## 2018-04-24 MED ORDER — ACETAMINOPHEN 500 MG PO TABS
1000.0000 mg | ORAL_TABLET | Freq: Four times a day (QID) | ORAL | Status: DC | PRN
  Administered 2018-04-24: 1000 mg via ORAL

## 2018-04-24 MED ORDER — CYCLOBENZAPRINE HCL 10 MG PO TABS: 10.0000 mg | ORAL_TABLET | Freq: Two times a day (BID) | ORAL | 0 refills | Status: DC | PRN

## 2018-04-24 MED ORDER — AMLODIPINE BESYLATE 5 MG PO TABS: 5.0000 mg | ORAL_TABLET | Freq: Every day | ORAL | 2 refills | Status: DC
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
## 2018-04-24 NOTE — Discharge Instructions (Signed)
Please take blood pressure medication as prescribed.  Contact [facility] to schedule appointment with primary care provider.  Please use Flexeril as needed for neck tension.  Take Tylenol as needed for headache.  Return to the urgent care emergency department for any worsening symptoms or urgent changes in health.

## 2018-05-13 DIAGNOSIS — J4520 Mild intermittent asthma, uncomplicated: Secondary | ICD-10-CM | POA: Insufficient documentation

## 2018-05-13 DIAGNOSIS — Z6841 Body Mass Index (BMI) 40.0 and over, adult: Secondary | ICD-10-CM | POA: Insufficient documentation

## 2018-05-13 DIAGNOSIS — I10 Essential (primary) hypertension: Secondary | ICD-10-CM | POA: Insufficient documentation

## 2018-05-19 DIAGNOSIS — S83511A Sprain of anterior cruciate ligament of right knee, initial encounter: Secondary | ICD-10-CM | POA: Insufficient documentation

## 2018-06-19 ENCOUNTER — Emergency Department
Admission: EM | Admit: 2018-06-19 | Discharge: 2018-06-19 | Disposition: A | Discharge status: Home / Self Care | Payer: Medicaid Other | Attending: Emergency Medicine | Admitting: Emergency Medicine

## 2018-06-19 ENCOUNTER — Emergency Department: Payer: Medicaid Other

## 2018-06-19 ENCOUNTER — Encounter: Payer: Self-pay | Admitting: Emergency Medicine

## 2018-06-19 DIAGNOSIS — G8918 Other acute postprocedural pain: Secondary | ICD-10-CM | POA: Diagnosis not present

## 2018-06-19 DIAGNOSIS — Z79899 Other long term (current) drug therapy: Secondary | ICD-10-CM | POA: Diagnosis not present

## 2018-06-19 DIAGNOSIS — J45909 Unspecified asthma, uncomplicated: Secondary | ICD-10-CM | POA: Insufficient documentation

## 2018-06-19 DIAGNOSIS — M79661 Pain in right lower leg: Secondary | ICD-10-CM | POA: Diagnosis present

## 2018-06-19 LAB — CBC WITH DIFFERENTIAL/PLATELET
Basophils Absolute: 0.1 10*3/uL (ref 0–0.1)
Basophils Relative: 1 %
EOS ABS: 0.5 10*3/uL (ref 0–0.7)
EOS PCT: 5 %
HCT: 40.4 % (ref 40.0–52.0)
Hemoglobin: 14.2 g/dL (ref 13.0–18.0)
LYMPHS PCT: 20 %
Lymphs Abs: 1.9 10*3/uL (ref 1.0–3.6)
MCH: 29.7 pg (ref 26.0–34.0)
MCHC: 35.2 g/dL (ref 32.0–36.0)
MCV: 84.4 fL (ref 80.0–100.0)
MONO ABS: 0.9 10*3/uL (ref 0.2–1.0)
MONOS PCT: 10 %
Neutro Abs: 6.1 10*3/uL (ref 1.4–6.5)
Neutrophils Relative %: 64 %
Platelets: 375 10*3/uL (ref 150–440)
RBC: 4.79 MIL/uL (ref 4.40–5.90)
RDW: 13.6 % (ref 11.5–14.5)
WBC: 9.4 10*3/uL (ref 3.8–10.6)

## 2018-06-19 LAB — COMPREHENSIVE METABOLIC PANEL
ALBUMIN: 4 g/dL (ref 3.5–5.0)
ALT: 38 U/L (ref 0–44)
ANION GAP: 10 (ref 5–15)
AST: 25 U/L (ref 15–41)
Alkaline Phosphatase: 57 U/L (ref 38–126)
BILIRUBIN TOTAL: 1.1 mg/dL (ref 0.3–1.2)
BUN: 14 mg/dL (ref 6–20)
CO2: 31 mmol/L (ref 22–32)
Calcium: 9.2 mg/dL (ref 8.9–10.3)
Chloride: 98 mmol/L (ref 98–111)
Creatinine, Ser: 1.21 mg/dL (ref 0.61–1.24)
GFR calc Af Amer: >60 mL/min (ref >60)
GFR calc non Af Amer: >60 mL/min (ref >60)
GLUCOSE: 106 mg/dL — AB (ref 70–99)
POTASSIUM: 4 mmol/L (ref 3.5–5.1)
SODIUM: 139 mmol/L (ref 135–145)
TOTAL PROTEIN: 8.3 g/dL — AB (ref 6.5–8.1)

## 2018-06-19 LAB — LACTIC ACID, PLASMA: LACTIC ACID, VENOUS: 0.8 mmol/L (ref 0.5–1.9)

## 2018-06-19 MED ORDER — MELOXICAM 15 MG PO TABS: 15.0000 mg | ORAL_TABLET | Freq: Every day | ORAL | 1 refills | Status: AC
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
## 2018-06-19 NOTE — ED Provider Notes (Signed)
[facility] Emergency Department Provider Note  ____________________________________________  Time seen: Approximately 4:25 PM  I have reviewed the triage vital signs and the nursing notes.   HISTORY  Chief Complaint Knee Pain    HPI Christopher Luna is a 32 y.o. male with a history of hypertension, presents to the emergency department with acute on chronic right knee pain.  Patient is status post ACL reconstruction on 06/09/2018 performed by Dr. Shirlee Limerick.  Patient was seen on 06/12/2018 for similar issues at [facility] and was worked up for a DVT at that time.  Patient is also followed up with his orthopedist who adjusted his pain medication but was unconcerned for infection.  Patient localizes his pain to anterior thigh and reports that it "just does not feel right".  He says that the pain seems to be worsening.  Pain is worse with movement and relieved with rest.  He has experienced prolonged immobilization and has a history of obesity with recent surgery.  Patient is not a daily smoker and no recent travel.  He denies shortness of breath, pleuritic chest pain or chest tightness.  He has had low-grade fever at home.  Patient has been taking pain medication prescribed by orthopedics which is oxycodone and ibuprofen.   Past Medical History:  Diagnosis Date  . Asthma   . Environmental allergies   . History of echocardiogram    a. Echo 6/17: EF 60-65%, no RWMA, diastolic function normal, mod LAE  . OSA (obstructive sleep apnea)    on CPAP    Patient Active Problem List   Diagnosis Date Noted  . Chest pain 03/05/2016  . SOB (shortness of breath) 03/05/2016  . Abnormal EKG 03/05/2016    Past Surgical History:  Procedure Laterality Date  . ARTHROSCOPIC REPAIR ACL Right 2005   x2    Prior to Admission medications   Medication Sig Start Date End Date Taking? Authorizing Provider  albuterol (PROVENTIL HFA;VENTOLIN HFA) 108 (90 Base) MCG/ACT inhaler  Inhale 1-2 puffs into the lungs every 6 (six) hours as needed for wheezing or shortness of breath. Use with spacer 01/06/17   Lutricia Feil, PA-C  amLODipine (NORVASC) 5 MG tablet Take 1 tablet (5 mg total) by mouth daily. 04/24/18   Evon Slack, PA-C  cyclobenzaprine (FLEXERIL) 10 MG tablet Take 1 tablet (10 mg total) by mouth 2 (two) times daily as needed for muscle spasms. 04/24/18   Evon Slack, PA-C  gabapentin (NEURONTIN) 300 MG capsule Take by mouth. 03/31/18 04/30/18  [provider]  meloxicam (MOBIC) 15 MG tablet Take 1 tablet (15 mg total) by mouth daily for 7 days. 06/19/18 06/26/18  Orvil Feil, PA-C    Allergies Patient has no known allergies.  Family History  Problem Relation Age of Onset  . Hypertension Mother   . Hypertension Father   . Asthma Neg Hx     Social History Social History   Tobacco Use  . Smoking status: Never Smoker  . Smokeless tobacco: Never Used  Substance Use Topics  . Alcohol use: Yes    Comment: social  . Drug use: No     Review of Systems  Constitutional: No fever/chills Eyes: No visual changes. No discharge ENT: No upper respiratory complaints. Cardiovascular: no chest pain. Respiratory: no cough. No SOB. Gastrointestinal: No abdominal pain.  No nausea, no vomiting.  No diarrhea.  No constipation. Musculoskeletal: Patient has right knee pain.  Skin: Negative for rash, abrasions, lacerations, ecchymosis. Neurological:  Negative for headaches, focal weakness or numbness.   ____________________________________________   PHYSICAL EXAM:  VITAL SIGNS: ED Triage Vitals  Enc Vitals Group     BP 06/19/18 1344 (!) 164/100     Pulse Rate 06/19/18 1344 (!) 103     Resp 06/19/18 1344 20     Temp 06/19/18 1344 98.9 F (37.2 C)     Temp Source 06/19/18 1344 Oral     SpO2 06/19/18 1344 98 %     Weight 06/19/18 1344 (!) 405 lb (183.7 kg)     Height 06/19/18 1344 6\' 5"  (1.956 m)     Head Circumference --      Peak Flow --       Pain Score 06/19/18 1343 9     Pain Loc --      Pain Edu? --      Excl. in GC? --      Constitutional: Patient is sleeping when I enter the exam room.  He is easily arousable. Eyes: Conjunctivae are normal. PERRL. EOMI. Head: Atraumatic. Cardiovascular: Normal rate, regular rhythm. Normal S1 and S2.  Good peripheral circulation. Respiratory: Normal respiratory effort without tachypnea or retractions. Lungs CTAB. Good air entry to the bases with no decreased or absent breath sounds. Musculoskeletal: Patient is able to perform active flexion and extension at the knee but does not tolerate other provocative testing.  Palpable dorsalis pedis pulse, right. Neurologic:  Normal speech and language. No gross focal neurologic deficits are appreciated.  Skin: Incision sites are non-erythematous or edematous.  No exudate is dried at incision sites. With gentle pressure, there is no weeping at surgical sites. Psychiatric: Mood and affect are normal. Speech and behavior are normal. Patient exhibits appropriate insight and judgement.   ____________________________________________   LABS (all labs ordered are listed, but only abnormal results are displayed)  Labs Reviewed  COMPREHENSIVE METABOLIC PANEL - Abnormal; Notable for the following components:      Result Value   Glucose, Bld 106 (*)    Total Protein 8.3 (*)    All other components within normal limits  CBC WITH DIFFERENTIAL/PLATELET  LACTIC ACID, PLASMA   ____________________________________________  EKG   ____________________________________________  RADIOLOGY I personally viewed and evaluated these images as part of my medical decision making, as well as reviewing the written report by the radiologist.  US Venous Img Lower Unilateral Right  Result Date: 06/19/2018 CLINICAL DATA:  Recent ACL surgery, right leg swelling and pain EXAM: Right LOWER EXTREMITY VENOUS DOPPLER ULTRASOUND TECHNIQUE: Gray-scale sonography with  graded compression, as well as color Doppler and duplex ultrasound were performed to evaluate the lower extremity deep venous systems from the level of the common femoral vein and including the common femoral, femoral, profunda femoral, popliteal and calf veins including the posterior tibial, peroneal and gastrocnemius veins when visible. The superficial great saphenous vein was also interrogated. Spectral Doppler was utilized to evaluate flow at rest and with distal augmentation maneuvers in the common femoral, femoral and popliteal veins. COMPARISON:  Radiograph 06/19/2018 FINDINGS: Contralateral Common Femoral Vein: Respiratory phasicity is normal and symmetric with the symptomatic side. No evidence of thrombus. Normal compressibility. Common Femoral Vein: No evidence of thrombus. Normal compressibility, respiratory phasicity and response to augmentation. Saphenofemoral Junction: No evidence of thrombus. Normal compressibility and flow on color Doppler imaging. Profunda Femoral Vein: No evidence of thrombus. Normal compressibility and flow on color Doppler imaging. Femoral Vein: No evidence of thrombus. Normal compressibility, respiratory phasicity and response to augmentation. Popliteal Vein:  No evidence of thrombus. Normal compressibility, respiratory phasicity and response to augmentation. Calf Veins: No evidence of thrombus. Normal compressibility and flow on color Doppler imaging. Other Findings: At least 2 complex fluid collections about the right knee. On the anterolateral side, this measures 6 x 1.9 x 3.1 cm. On the anteromedial side, this measures 5.8 x 2.2 x 5.6 cm. IMPRESSION: 1. Negative for acute DVT of the right lower extremity 2. At least 2 complex fluid collections along the anterolateral and anteromedial side of the knee which could reflect postoperative collection versus inflammatory or infected fluid collection. Electronically Signed   By: Jasmine Pang M.D.   On: 06/19/2018 17:45   Dg Knee  Complete 4 Views Right  Result Date: 06/19/2018 CLINICAL DATA:  Pain EXAM: RIGHT KNEE - COMPLETE 4+ VIEW COMPARISON:  10/05/2003 FINDINGS: Postoperative changes from ACL repair are noted. Calcified lesion in the distal femur has a nonaggressive appearance. It is centrally located with calcified chondroid matrix. No acute fracture or dislocation. Mild degenerative change. Joint effusion is noted. IMPRESSION: No acute bony pathology. Postoperative and chronic changes are noted. Electronically Signed   By: Jolaine Click M.D.   On: 06/19/2018 14:37    ____________________________________________    PROCEDURES  Procedure(s) performed:    Procedures    Medications - No data to display   ____________________________________________   INITIAL IMPRESSION / ASSESSMENT AND PLAN / ED COURSE  Pertinent labs & imaging results that were available during my care of the patient were reviewed by me and considered in my medical decision making (see chart for details).  Review of the [place] CSRS was performed in accordance of the [facility] prior to dispensing any controlled drugs.    Assessment and Plan:  Right knee pain:  Patient presents to the emergency department with right knee pain.  Differential diagnosis included right knee cellulitis, septic arthritis and postop pain.  CBC showed no evidence of leukocytosis in the emergency department.  Skin overlying right knee was non erythematous with no signs of exudate or soft tissue swelling surrounding surgical incisions.  A soft tissue ultrasound and venous ultrasound was conducted in the emergency department which revealed no evidence of thromboembolism.  There were two complex fluid collections identified near surgical wound sites which are likely postoperative collections.  Patient has a follow-up appointment with his orthopedic surgeon on October 2.  There are no physical exam findings that would warrant aspiration in the emergency department at this time.   The importance of appropriate follow-up with orthopedics was emphasized.  All patient questions were answered.  ____________________________________________  FINAL CLINICAL IMPRESSION(S) / ED DIAGNOSES  Final diagnoses:  Post-operative pain      NEW MEDICATIONS STARTED DURING THIS VISIT:  ED Discharge Orders         Ordered    meloxicam (MOBIC) 15 MG tablet  Daily     06/19/18 1755              This chart was dictated using voice recognition software/Dragon. Despite best efforts to proofread, errors can occur which can change the meaning. Any change was purely unintentional.    Gasper Lloyd 06/19/18 2038    Jene Every, MD 06/19/18 2156

## 2018-06-19 NOTE — ED Notes (Signed)
Pain  - dull   Radiating  From r  Knee  At  Thigh  No  Swelling   Pain on weight bearing  Symp[toms  X 8 days  Had  r knee  Surgery   10  Days ago   Seen for  orthopedic   Dr 4 days  Ago

## 2018-07-21 ENCOUNTER — Ambulatory Visit: Payer: Medicaid Other | Admitting: Speech Pathology

## 2018-07-28 ENCOUNTER — Ambulatory Visit: Payer: Medicaid Other | Admitting: Speech Pathology

## 2018-07-30 ENCOUNTER — Ambulatory Visit: Payer: Medicaid Other | Attending: Physician Assistant | Admitting: Speech Pathology

## 2018-07-30 ENCOUNTER — Encounter: Payer: Self-pay | Admitting: Speech Pathology

## 2018-07-30 ENCOUNTER — Other Ambulatory Visit: Payer: Self-pay

## 2018-07-30 DIAGNOSIS — R490 Dysphonia: Secondary | ICD-10-CM | POA: Insufficient documentation
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
## 2018-08-05 ENCOUNTER — Ambulatory Visit: Payer: Medicaid Other | Admitting: Speech Pathology

## 2018-08-06 ENCOUNTER — Ambulatory Visit: Payer: Medicaid Other | Admitting: Speech Pathology

## 2018-08-10 ENCOUNTER — Encounter: Payer: Self-pay | Admitting: Speech Pathology

## 2018-08-10 ENCOUNTER — Ambulatory Visit: Payer: Medicaid Other | Admitting: Speech Pathology

## 2018-08-10 DIAGNOSIS — R490 Dysphonia: Secondary | ICD-10-CM
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
## 2018-08-23 ENCOUNTER — Ambulatory Visit: Payer: Medicaid Other | Admitting: Speech Pathology

## 2018-08-26 ENCOUNTER — Emergency Department
Admission: EM | Admit: 2018-08-26 | Discharge: 2018-08-26 | Disposition: A | Discharge status: Home / Self Care | Payer: Medicaid Other | Attending: Emergency Medicine | Admitting: Emergency Medicine

## 2018-08-26 ENCOUNTER — Emergency Department: Payer: Medicaid Other

## 2018-08-26 ENCOUNTER — Encounter: Payer: Self-pay | Admitting: Emergency Medicine

## 2018-08-26 DIAGNOSIS — I10 Essential (primary) hypertension: Secondary | ICD-10-CM | POA: Diagnosis not present

## 2018-08-26 DIAGNOSIS — M25461 Effusion, right knee: Secondary | ICD-10-CM | POA: Diagnosis not present

## 2018-08-26 DIAGNOSIS — R2241 Localized swelling, mass and lump, right lower limb: Secondary | ICD-10-CM | POA: Diagnosis not present

## 2018-08-26 DIAGNOSIS — M25561 Pain in right knee: Secondary | ICD-10-CM

## 2018-08-26 DIAGNOSIS — Z79899 Other long term (current) drug therapy: Secondary | ICD-10-CM | POA: Insufficient documentation

## 2018-08-26 DIAGNOSIS — R609 Edema, unspecified: Secondary | ICD-10-CM

## 2018-08-26 DIAGNOSIS — J45909 Unspecified asthma, uncomplicated: Secondary | ICD-10-CM | POA: Diagnosis not present

## 2018-08-26 HISTORY — DX: Essential (primary) hypertension: I10

## 2018-08-26 MED ORDER — IBUPROFEN 600 MG PO TABS
ORAL_TABLET | ORAL | Status: AC
  Filled 2018-08-26: qty 1

## 2018-08-26 MED ORDER — IBUPROFEN 600 MG PO TABS
600.0000 mg | ORAL_TABLET | Freq: Once | ORAL | Status: AC
  Administered 2018-08-26: 600 mg via ORAL

## 2018-08-26 MED ORDER — IBUPROFEN 800 MG PO TABS
ORAL_TABLET | ORAL | Status: AC
  Filled 2018-08-26: qty 1

## 2018-08-26 NOTE — ED Triage Notes (Signed)
Pt reports right knee is giving out on him. Pt states had surgery on it in September for the same thing. Pt ambulatory with cain. Pt reports last time it was fluid and he had to go to his MD to have it drained.

## 2018-08-26 NOTE — Discharge Instructions (Signed)
Follow-up with your primary care provider for recheck of your blood pressure as it was elevated in the ED today.  Keep your appointment with your orthopedist tomorrow and continue using your cane as you have been doing.  You may apply ice or heat to your knee as needed for discomfort.  Elevate often if needed for swelling.
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
## 2018-08-26 NOTE — ED Notes (Signed)
See triage note  Presents with pain and swelling for a few days   States his knee has been giving out  Hx of "fluid on right knee " in past  Unable to bear full wt

## 2018-08-30 ENCOUNTER — Ambulatory Visit: Payer: Medicaid Other | Attending: Physician Assistant | Admitting: Physical Therapy

## 2018-08-30 ENCOUNTER — Other Ambulatory Visit: Payer: Self-pay | Admitting: Neurology

## 2018-08-30 ENCOUNTER — Encounter: Payer: Self-pay | Admitting: Physical Therapy

## 2018-08-30 DIAGNOSIS — Z9181 History of falling: Secondary | ICD-10-CM | POA: Diagnosis present

## 2018-08-30 DIAGNOSIS — M6281 Muscle weakness (generalized): Secondary | ICD-10-CM | POA: Diagnosis present

## 2018-08-30 DIAGNOSIS — M25561 Pain in right knee: Secondary | ICD-10-CM | POA: Insufficient documentation

## 2018-08-30 DIAGNOSIS — G8929 Other chronic pain: Secondary | ICD-10-CM | POA: Insufficient documentation

## 2018-08-30 DIAGNOSIS — R490 Dysphonia: Secondary | ICD-10-CM | POA: Insufficient documentation

## 2018-08-30 DIAGNOSIS — R269 Unspecified abnormalities of gait and mobility: Secondary | ICD-10-CM

## 2018-08-30 DIAGNOSIS — G4452 New daily persistent headache (NDPH): Secondary | ICD-10-CM
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
## 2018-08-31 ENCOUNTER — Ambulatory Visit: Payer: Medicaid Other | Admitting: Speech Pathology

## 2018-09-02 ENCOUNTER — Ambulatory Visit: Payer: Medicaid Other | Admitting: Speech Pathology

## 2018-09-02 DIAGNOSIS — R490 Dysphonia: Secondary | ICD-10-CM

## 2018-09-02 DIAGNOSIS — M25561 Pain in right knee: Secondary | ICD-10-CM | POA: Diagnosis not present

## 2018-09-03 ENCOUNTER — Encounter: Payer: Self-pay | Admitting: Speech Pathology
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
## 2018-09-07 ENCOUNTER — Ambulatory Visit: Payer: Medicaid Other | Admitting: Physical Therapy

## 2018-09-07 ENCOUNTER — Ambulatory Visit: Payer: Medicaid Other | Admitting: Speech Pathology

## 2018-09-07 ENCOUNTER — Encounter: Payer: Self-pay | Admitting: Physical Therapy

## 2018-09-07 DIAGNOSIS — M6281 Muscle weakness (generalized): Secondary | ICD-10-CM

## 2018-09-07 DIAGNOSIS — Z9181 History of falling: Secondary | ICD-10-CM

## 2018-09-07 DIAGNOSIS — M25561 Pain in right knee: Secondary | ICD-10-CM | POA: Diagnosis not present

## 2018-09-07 DIAGNOSIS — G8929 Other chronic pain: Secondary | ICD-10-CM

## 2018-09-07 DIAGNOSIS — R269 Unspecified abnormalities of gait and mobility: Secondary | ICD-10-CM

## 2018-09-07 NOTE — Therapy (Signed)
[place] [facility] [address]. [place], [place], [address] Phone: [phone]   Fax:  [phone]  Physical Therapy Treatment  Patient Details  Name: Christopher Luna MRN: [id number] Date of Birth: 1986/01/23 Referring Provider (PT): Dr. Pennie Rushing.    Encounter Date: 09/07/2018  PT End of Session - 09/07/18 1253    Visit Number  2    Number of Visits  8    Date for PT Re-Evaluation  09/27/18    PT Start Time  1033    PT Stop Time  1120    PT Time Calculation (min)  47 min    Activity Tolerance  Patient tolerated treatment well;Patient limited by pain    Behavior During Therapy  [facility] for tasks assessed/performed       Past Medical History:  Diagnosis Date  . Asthma   . Environmental allergies   . History of echocardiogram    a. Echo 6/17: EF 60-65%, no RWMA, diastolic function normal, mod LAE  . Hypertension   . OSA (obstructive sleep apnea)    on CPAP    Past Surgical History:  Procedure Laterality Date  . ARTHROSCOPIC REPAIR ACL Right 2005   x2    There were no vitals filed for this visit.  Subjective Assessment - 09/07/18 1252    Subjective  Pt. entered PT with no assistive device.  Pt. left SPC in car.  Pt. has moderate R antalgic gait pattern.  Pt. states he had MRI on hip last week (f/u with MD on Thursday 12/19).      Limitations  Lifting;Standing;Walking;House hold activities    Diagnostic tests  see imaging/ pt. scheduled for MRI on R hip 09/02/18    Patient Stated Goals  Increase R knee/LE muscle strength and improve pain-free mobility    Currently in Pain?  Yes    Pain Score  5     Pain Location  Knee    Pain Orientation  Right    Pain Descriptors / Indicators  Aching    Pain Type  Chronic pain          There.ex.:  Forward/ backwards/ lateral walking in //-bars with heavy use of UE assist noted (5x each) Standing hip flexion/ heel raises/ hamstring curls 20x each (cuing for upright posture) Resisted  gait 2BTB 10x all 4-planes (difficulty with consistent heel strike/ toe off).  STS 10x Walking in PT clinic with cuing for proper BOS/ step pattern/ heel strike Reviewed HEP Nustep L8 10 min. B UE/LE (no UE for last 5 min.).     PT Long Term Goals - 08/30/18 1922      PT LONG TERM GOAL #1   Title  Pt. will increase FOTO to 50 to improve pain-free mobility.      Baseline  initial FOTO: 37    Time  4    Period  Weeks    Status  New    Target Date  09/27/18      PT LONG TERM GOAL #2   Title  Pt. will increase R LE muscle strength to grossly 5/5 MMT (quad/ hip flexion) to improve pain-free mobility.      Baseline  R LE muscle strength grossly 4/5 MMT (pain with resisted knee extension).     Time  4    Period  Weeks    Status  New    Target Date  09/27/18      PT LONG TERM GOAL #3   Title  Pt. able to stand from chair/ commode with no UE assist and proper mechanics to improve pain-free mobility.    Baseline  extra time/ use of B UE to stand from chair.      Time  4    Period  Weeks    Status  New    Target Date  09/27/18      PT LONG TERM GOAL #4   Title  Pt. able to ambulate on outside surfaces with more normalized gait pattern no assistive device safely.      Baseline  Moderate R antalgic gait pattern with ues of SPC    Time  4    Period  Weeks    Status  New    Target Date  09/27/18            Plan - 09/07/18 1254    Clinical Impression Statement  Pt. had several episodes of sharp R knee pain during partial knee flexion during standing ther.ex. in //-bars.  Pt. never lost balance secondary to heavy use of B UE in //-bars.  Good patellar tracking noted during Nustep ex.  Pt. has difficulty with consistent heel strike/ toe off while walking in //-bars/ hallway with and without use of SPC.  No charnge to initial HEP at this time.  Pt. instructed to contact PT if any change in status after getting MRI results for R hip.     Clinical Presentation  Evolving    Clinical  Decision Making  Moderate    Rehab Potential  Good    PT Frequency  2x / week    PT Duration  4 weeks    PT Treatment/Interventions  ADLs/Self Care Home Management;Aquatic Therapy;Cryotherapy;Electrical Stimulation;Moist Heat;Gait training;Stair training;Functional mobility training;Therapeutic activities;Therapeutic exercise;Balance training;Patient/family education;Neuromuscular re-education;Manual techniques;Dry needling;Passive range of motion    PT Next Visit Plan  Progress R LE muscle strength/ gait training    PT Home Exercise Plan  see handouts       Patient will benefit from skilled therapeutic intervention in order to improve the following deficits and impairments:  Abnormal gait, Improper body mechanics, Pain, Decreased mobility, Decreased activity tolerance, Decreased endurance, Decreased range of motion, Decreased strength, Hypomobility, Obesity, Impaired flexibility, Difficulty walking, Decreased balance  Visit Diagnosis: Chronic pain of right knee  Muscle weakness (generalized)  Gait difficulty  History of falling     Problem List Patient Active Problem List   Diagnosis Date Noted  . Chest pain 03/05/2016  . SOB (shortness of breath) 03/05/2016  . Abnormal EKG 03/05/2016   Cammie McgeeMichael C Karlissa Aron, PT, DPT # [id number] 09/07/2018, 12:57 PM  [place] [facility] [address] [place], [place], [address] Phone: [phone]   Fax:  [phone]  Name: Christopher Luna MRN: [id number] Date of Birth: 06-25-1986

## 2018-09-09 ENCOUNTER — Ambulatory Visit: Payer: Medicaid Other | Admitting: Physical Therapy

## 2018-09-09 ENCOUNTER — Encounter: Payer: Self-pay | Admitting: Physical Therapy

## 2018-09-09 DIAGNOSIS — M25561 Pain in right knee: Secondary | ICD-10-CM | POA: Diagnosis not present

## 2018-09-09 DIAGNOSIS — R269 Unspecified abnormalities of gait and mobility: Secondary | ICD-10-CM

## 2018-09-09 DIAGNOSIS — G8929 Other chronic pain: Secondary | ICD-10-CM

## 2018-09-09 DIAGNOSIS — M6281 Muscle weakness (generalized): Secondary | ICD-10-CM

## 2018-09-09 DIAGNOSIS — Z9181 History of falling: Secondary | ICD-10-CM
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
## 2018-09-14 ENCOUNTER — Encounter: Payer: Self-pay | Admitting: Physical Therapy

## 2018-09-14 ENCOUNTER — Ambulatory Visit: Payer: Medicaid Other | Admitting: Physical Therapy

## 2018-09-14 DIAGNOSIS — M25561 Pain in right knee: Secondary | ICD-10-CM | POA: Diagnosis not present

## 2018-09-14 DIAGNOSIS — R269 Unspecified abnormalities of gait and mobility: Secondary | ICD-10-CM

## 2018-09-14 DIAGNOSIS — M6281 Muscle weakness (generalized): Secondary | ICD-10-CM

## 2018-09-14 DIAGNOSIS — G8929 Other chronic pain: Secondary | ICD-10-CM

## 2018-09-14 NOTE — Therapy (Signed)
[facility] [address] [place], [place], [address] Phone: [phone]   Fax:  [phone]  Physical Therapy Treatment  Patient Details  Name: Christopher Luna MRN: [id number] Date of Birth: 10-22-1985 Referring Provider (PT): Dr. Pennie RushingPollock, Jr.    Encounter Date: 09/14/2018   Treatment: 4 of 8.  Recert date: 09/27/18 0903 to 1001   Past Medical History:  Diagnosis Date  . Asthma   . Environmental allergies   . History of echocardiogram    a. Echo 6/17: EF 60-65%, no RWMA, diastolic function normal, mod LAE  . Hypertension   . OSA (obstructive sleep apnea)    on CPAP    Past Surgical History:  Procedure Laterality Date  . ARTHROSCOPIC REPAIR ACL Right 2005   x2    There were no vitals filed for this visit.    Pt. reports 7/10 R lateral knee/ distal quad pain prior to tx. session. Pt. states he has been active more than usually over past couple days. Pt. returns to MD on 09/21/18 to discuss hip MRI results.      Discussed getting MD script for brace.     There.ex.:  Scifit L6.5 10 min.  B UE/LE (warm-up) Forward/ backwards/ lateral walking in //-bars with heavy use of UE assist noted (5x each) Standing ex. (5#):  hip flexion/ hip abduction/ heel raises/ hamstring curls 30x each (cuing for upright posture).  Walking in PT clinic/ step ups at stairs with 5# ankle wts. (moderate UE assist).  Recip. Stair ex. With min. UE assist.  Difficulty with R quad eccentric muscle control.   Supine R LE heel slides and SAQ/ patellar mobs (all plane)- increase pain with medial glides.   Walking in PT clinic with cuing for proper BOS/ step pattern/ heel strike Reviewed HEP      PT Long Term Goals - 08/30/18 1922      PT LONG TERM GOAL #1   Title  Pt. will increase FOTO to 50 to improve pain-free mobility.      Baseline  initial FOTO: 37    Time  4    Period  Weeks    Status  New    Target Date  09/27/18      PT LONG TERM  GOAL #2   Title  Pt. will increase R LE muscle strength to grossly 5/5 MMT (quad/ hip flexion) to improve pain-free mobility.      Baseline  R LE muscle strength grossly 4/5 MMT (pain with resisted knee extension).     Time  4    Period  Weeks    Status  New    Target Date  09/27/18      PT LONG TERM GOAL #3   Title  Pt. able to stand from chair/ commode with no UE assist and proper mechanics to improve pain-free mobility.    Baseline  extra time/ use of B UE to stand from chair.      Time  4    Period  Weeks    Status  New    Target Date  09/27/18      PT LONG TERM GOAL #4   Title  Pt. able to ambulate on outside surfaces with more normalized gait pattern no assistive device safely.      Baseline  Moderate R antalgic gait pattern with ues of SPC    Time  4    Period  Weeks    Status  New  Target Date  09/27/18         Several episodes of sharp R knee pain with R LE wt. bearing during resisted hip/knee ex. Pt. ambulates with guarded/ hesitant gait pattern secondary to R knee pain/ instability with standing and walking. Pt. understands importance of LE strengthening ex. program and will continue with HEP on a consistent basis. PT recommends use of R knee stability/ compression brace with standing and walking to promote greater stability with progression of ex. program.       Patient will benefit from skilled therapeutic intervention in order to improve the following deficits and impairments:  Abnormal gait, Improper body mechanics, Pain, Decreased mobility, Decreased activity tolerance, Decreased endurance, Decreased range of motion, Decreased strength, Hypomobility, Obesity, Impaired flexibility, Difficulty walking, Decreased balance  Visit Diagnosis: Chronic pain of right knee  Muscle weakness (generalized)  Gait difficulty     Problem List Patient Active Problem List   Diagnosis Date Noted  . Chest pain 03/05/2016  . SOB (shortness of breath) 03/05/2016  .  Abnormal EKG 03/05/2016   Cammie McgeeMichael C Nikolay Demetriou, PT, DPT # [id number] 09/16/2018, 7:18 AM  [place] [facility] [address] [place], [place], [address] Phone: [phone]   Fax:  [phone]  Name: Christopher Luna MRN: [id number] Date of Birth: 1985-10-25

## 2018-09-16 ENCOUNTER — Ambulatory Visit: Payer: Medicaid Other | Admitting: Physical Therapy

## 2018-09-17 ENCOUNTER — Ambulatory Visit: Payer: Medicaid Other | Admitting: Speech Pathology

## 2018-09-21 ENCOUNTER — Ambulatory Visit
Admission: RE | Admit: 2018-09-21 | Discharge: 2018-09-21 | Disposition: A | Discharge status: Home / Self Care | Payer: Medicaid Other | Source: Ambulatory Visit | Attending: Neurology | Admitting: Neurology

## 2018-09-21 ENCOUNTER — Ambulatory Visit: Payer: Medicaid Other | Admitting: Physical Therapy

## 2018-09-21 DIAGNOSIS — G4452 New daily persistent headache (NDPH): Secondary | ICD-10-CM | POA: Diagnosis not present

## 2018-09-23 ENCOUNTER — Encounter: Payer: Self-pay | Admitting: Physical Therapy

## 2018-09-23 ENCOUNTER — Ambulatory Visit: Payer: Medicaid Other | Attending: Physician Assistant | Admitting: Physical Therapy

## 2018-09-23 DIAGNOSIS — Z9181 History of falling: Secondary | ICD-10-CM | POA: Insufficient documentation

## 2018-09-23 DIAGNOSIS — R269 Unspecified abnormalities of gait and mobility: Secondary | ICD-10-CM | POA: Diagnosis present

## 2018-09-23 DIAGNOSIS — G8929 Other chronic pain: Secondary | ICD-10-CM | POA: Insufficient documentation

## 2018-09-23 DIAGNOSIS — M6281 Muscle weakness (generalized): Secondary | ICD-10-CM | POA: Insufficient documentation

## 2018-09-23 DIAGNOSIS — M25561 Pain in right knee: Secondary | ICD-10-CM | POA: Insufficient documentation

## 2018-09-23 DIAGNOSIS — R490 Dysphonia: Secondary | ICD-10-CM | POA: Insufficient documentation

## 2018-09-23 NOTE — Therapy (Addendum)
[place] [facility] [address] [place], [place], [address] Phone: [phone]   Fax:  [phone]  Physical Therapy Treatment  Patient Details  Name: Christopher Luna: [id number] Date of Birth: Dec 05, 1985 Referring Provider (PT): Dr. Pennie Rushing.    Encounter Date: 09/23/2018  PT End of Session - 09/27/18 1722    Visit Number  5    Number of Visits  8    Date for PT Re-Evaluation  09/27/18    PT Start Time  1044    PT Stop Time  1133    PT Time Calculation (min)  49 min    Activity Tolerance  Patient tolerated treatment well;Patient limited by pain    Behavior During Therapy  [facility] for tasks assessed/performed       Past Medical History:  Diagnosis Date  . Asthma   . Environmental allergies   . History of echocardiogram    a. Echo 6/17: EF 60-65%, no RWMA, diastolic function normal, mod LAE  . Hypertension   . OSA (obstructive sleep apnea)    on CPAP    Past Surgical History:  Procedure Laterality Date  . ARTHROSCOPIC REPAIR ACL Right 2005   x2    There were no vitals filed for this visit.   Pt. missed last scheduled PT appt. and states he cancelled MD appt. due to transportation issues. Pt. rescheduled MD f/u appt. for 09/30/18. Pt. did not receive results of hip MRI at this time.     FOTO: 41 (slight improvement from 37 during initial evaluation).      There.ex.:  Forward/ backwards/ lateral walking in //-bars with heavy use of UE assist noted (5x each) progressing to less UE assist with cuing. Resisted gait 2BTB 10x all 4-planes with cuing to decrease UE assist. Standing ex. (no weight today):  hip flexion/ hip abduction/ heel raises/ hamstring curls 30x each (cuing for upright posture).  Supine bolster SAQ/ hip flexion (with PT assist as needed) Recip. Stair ex. With min. UE assist.  Difficulty with R quad eccentric muscle control.   Supine R LE heel slides and SAQ/ patellar mobs (all plane)- increase pain with medial  glides.   Walking in PT clinic with cuing for proper BOS/ step pattern/ heel strike Scifit L6.5 10 min.  B UE/LE (warm-up) Reviewed HEP   PT Long Term Goals - 08/30/18 1922      PT LONG TERM GOAL #1   Title  Pt. will increase FOTO to 50 to improve pain-free mobility.      Baseline  initial FOTO: 37    Time  4    Period  Weeks    Status  New    Target Date  09/27/18      PT LONG TERM GOAL #2   Title  Pt. will increase R LE muscle strength to grossly 5/5 MMT (quad/ hip flexion) to improve pain-free mobility.      Baseline  R LE muscle strength grossly 4/5 MMT (pain with resisted knee extension).     Time  4    Period  Weeks    Status  New    Target Date  09/27/18      PT LONG TERM GOAL #3   Title  Pt. able to stand from chair/ commode with no UE assist and proper mechanics to improve pain-free mobility.    Baseline  extra time/ use of B UE to stand from chair.      Time  4  Period  Weeks    Status  New    Target Date  09/27/18      PT LONG TERM GOAL #4   Title  Pt. able to ambulate on outside surfaces with more normalized gait pattern no assistive device safely.      Baseline  Moderate R antalgic gait pattern with ues of SPC    Time  4    Period  Weeks    Status  New    Target Date  09/27/18            Plan - 09/27/18 1723    Clinical Impression Statement  Pt. ambulates around PT clinic with moderate R antalgic gait pattern with and without use of SPC/ //-bars.  2 episodes of R knee buckling but pt. able to self-correct outside //-bars.  R hip pain reported during all standing ther.ex. and pt. is waiting to receive MRI results but missed last MD appt.  No changes to current HEP and pt. instructed to focus on quad/ hip strengthening at this time.      Clinical Presentation  Evolving    Clinical Decision Making  Moderate    Rehab Potential  Good    PT Frequency  2x / week    PT Duration  4 weeks    PT Treatment/Interventions  ADLs/Self Care Home Management;Aquatic  Therapy;Cryotherapy;Electrical Stimulation;Moist Heat;Gait training;Stair training;Functional mobility training;Therapeutic activities;Therapeutic exercise;Balance training;Patient/family education;Neuromuscular re-education;Manual techniques;Dry needling;Passive range of motion    PT Next Visit Plan  Progress R LE muscle strength/ gait training.  CHECK GOALS/ RECERT/ CAID AUTHORIZATION?    PT Home Exercise Plan  see handouts       Patient will benefit from skilled therapeutic intervention in order to improve the following deficits and impairments:  Abnormal gait, Improper body mechanics, Pain, Decreased mobility, Decreased activity tolerance, Decreased endurance, Decreased range of motion, Decreased strength, Hypomobility, Obesity, Impaired flexibility, Difficulty walking, Decreased balance  Visit Diagnosis: Chronic pain of right knee  Muscle weakness (generalized)  Gait difficulty     Problem List Patient Active Problem List   Diagnosis Date Noted  . Chest pain 03/05/2016  . SOB (shortness of breath) 03/05/2016  . Abnormal EKG 03/05/2016   [name], PT, DPT # [id number] 09/27/2018, 5:27 PM  [place] [facility] [address] [place], [place], [address] Phone: [phone]   Fax:  [phone]  Name: Christopher Luna: [id number] Date of Birth: [birth year]

## 2018-09-28 ENCOUNTER — Ambulatory Visit: Payer: Medicaid Other | Admitting: Physical Therapy

## 2018-09-29 ENCOUNTER — Encounter: Payer: Self-pay | Admitting: Physical Therapy

## 2018-09-29 ENCOUNTER — Ambulatory Visit: Payer: Medicaid Other | Admitting: Physical Therapy

## 2018-09-29 DIAGNOSIS — M6281 Muscle weakness (generalized): Secondary | ICD-10-CM

## 2018-09-29 DIAGNOSIS — Z9181 History of falling: Secondary | ICD-10-CM

## 2018-09-29 DIAGNOSIS — G8929 Other chronic pain: Secondary | ICD-10-CM

## 2018-09-29 DIAGNOSIS — R269 Unspecified abnormalities of gait and mobility: Secondary | ICD-10-CM

## 2018-09-29 DIAGNOSIS — M25561 Pain in right knee: Secondary | ICD-10-CM | POA: Diagnosis not present

## 2018-09-29 NOTE — Therapy (Signed)
[facility] [address]. [place], [place], [address] Phone: [phone]   Fax:  [phone]  Physical Therapy Treatment  Patient Details  Name: Christopher Luna MRN: [id number] Date of Birth: [birth year] Referring Provider (PT): Dr. Nelwyn Salisbury.    Encounter Date: 09/29/2018  PT End of Session - 09/29/18 0742    Visit Number  6    Number of Visits  12    Date for PT Re-Evaluation  11/10/18    PT Start Time  0733    PT Stop Time  0831    PT Time Calculation (min)  58 min    Activity Tolerance  Patient tolerated treatment well;Patient limited by pain    Behavior During Therapy  [facility] for tasks assessed/performed       Past Medical History:  Diagnosis Date  . Asthma   . Environmental allergies   . History of echocardiogram    a. Echo 6/17: EF 60-65%, no RWMA, diastolic function normal, mod LAE  . Hypertension   . OSA (obstructive sleep apnea)    on CPAP    Past Surgical History:  Procedure Laterality Date  . ARTHROSCOPIC REPAIR ACL Right 2005   x2    There were no vitals filed for this visit.  Subjective Assessment - 09/29/18 0739    Subjective  Pt. entered PT with no SPC and slight R antalgic/ stiff gait pattern.  Pt. reports 2/10 R knee (medial/lateral aspect) discomfort at this time.  Pt. returns to MD tomorrow to discuss B hips/R knee.      Limitations  Lifting;Standing;Walking;House hold activities    Diagnostic tests  see imaging/ pt. scheduled for MRI on R hip 09/02/18    Patient Stated Goals  Increase R knee/LE muscle strength and improve pain-free mobility    Currently in Pain?  Yes    Pain Score  2     Pain Location  Knee    Pain Orientation  Right;Medial    Pain Descriptors / Indicators  Aching    Pain Type  Chronic pain        There.ex.:  Scifit L6.5 10 min.  B UE/LE (warm-up/ no charge) Forward/ backwards/ lateral walking in //-bars with heavy use of UE assist noted (5x each) High marching in //-bars  and added walking lunges with small holds (minimal UE assist) Resisted gait 2BTB: 10x L/R lateral with partial squat  Supine R hip/ hamstring stretches on blue mat table (3x30 sec. Each). Supine SAQ with manual resistance/ hip flexion/ bolster bridging 20x.   Wall squats with ball 20x (cuing to correct lateral lean) Walking in PT clinic with cuing for proper BOS/ step pattern/ heel strike Reviewed HEP     PT Long Term Goals - 09/29/18 1145      PT LONG TERM GOAL #1   Title  Pt. will increase FOTO to 50 to improve pain-free mobility.      Baseline  initial FOTO: 37.  09/29/18: 39 (no significant changes)    Time  6    Period  Weeks    Status  Not Met    Target Date  11/10/18      PT LONG TERM GOAL #2   Title  Pt. will increase R LE muscle strength to grossly 5/5 MMT (quad/ hip flexion) to improve pain-free mobility.      Baseline  R LE muscle strength grossly 4/5 MMT (pain with resisted knee extension).     Time  6  Period  Weeks    Status  Not Met    Target Date  11/10/18      PT LONG TERM GOAL #3   Title  Pt. able to stand from chair/ commode with no UE assist and proper mechanics to improve pain-free mobility.    Baseline  pt. able to stand from rolling stool with no UE assist.     Time  4    Period  Weeks    Status  Achieved    Target Date  09/29/18      PT LONG TERM GOAL #4   Title  Pt. able to ambulate on outside surfaces with more normalized gait pattern no assistive device safely.      Baseline  Moderate R antalgic gait pattern with or without SPC    Time  6    Period  Weeks    Status  Partially Met    Target Date  11/10/18      PT LONG TERM GOAL #5   Title  Pt. able to complete 30 minutes of ther.ex./ gym based exercise with no increase c/o pain to improve overall muscle endurance/ return to work.     Baseline  fatigues with ther.ex./ currently not working.     Time  6    Period  Weeks    Status  New    Target Date  11/10/18            Plan -  09/29/18 1139    Clinical Impression Statement  Pt. demonstrates L lateral lean during wall squats and was able to correct with verbal/tactile cuing.  Pt. continues to ambulate with R antalgic gait with decrease heel strike which was corrected with cuing.  Several episodes of R hip "popping" and discomfort with SAQ with manual resistance.  Pt. understands current HEP and will continue as tolerated.   Pt. has MD f/u scheduled for tomorrow and completed FOTO with no significant changes.      Clinical Presentation  Evolving    Clinical Decision Making  Moderate    Rehab Potential  Good    PT Frequency  1x / week    PT Duration  6 weeks    PT Treatment/Interventions  ADLs/Self Care Home Management;Aquatic Therapy;Cryotherapy;Electrical Stimulation;Moist Heat;Gait training;Stair training;Functional mobility training;Therapeutic activities;Therapeutic exercise;Balance training;Patient/family education;Neuromuscular re-education;Manual techniques;Dry needling;Passive range of motion    PT Next Visit Plan  Progress R LE muscle strength/ gait training.  Discuss MD f/u visit.      PT Home Exercise Plan  see handouts       Patient will benefit from skilled therapeutic intervention in order to improve the following deficits and impairments:  Abnormal gait, Improper body mechanics, Pain, Decreased mobility, Decreased activity tolerance, Decreased endurance, Decreased range of motion, Decreased strength, Hypomobility, Obesity, Impaired flexibility, Difficulty walking, Decreased balance  Visit Diagnosis: Chronic pain of right knee  Muscle weakness (generalized)  Gait difficulty  History of falling     Problem List Patient Active Problem List   Diagnosis Date Noted  . Chest pain 03/05/2016  . SOB (shortness of breath) 03/05/2016  . Abnormal EKG 03/05/2016   Pura Spice, PT, DPT # [phone] 09/29/2018, 11:51 AM  [facility] [address] [place], [place], [address] Phone: [phone]   Fax:  [phone]  Name: Christopher Luna MRN: [id number] Date of Birth: 08-May-1986

## 2018-09-29 NOTE — Addendum Note (Signed)
Addended by: Cammie Mcgee on: 09/29/2018 07:12 PM   Modules accepted: Orders

## 2018-10-02 ENCOUNTER — Emergency Department: Payer: Medicaid Other

## 2018-10-02 ENCOUNTER — Encounter: Payer: Self-pay | Admitting: Emergency Medicine

## 2018-10-02 ENCOUNTER — Other Ambulatory Visit: Payer: Self-pay

## 2018-10-02 ENCOUNTER — Emergency Department
Admission: EM | Admit: 2018-10-02 | Discharge: 2018-10-02 | Disposition: A | Discharge status: Home / Self Care | Payer: Medicaid Other | Attending: Emergency Medicine | Admitting: Emergency Medicine

## 2018-10-02 DIAGNOSIS — R52 Pain, unspecified: Secondary | ICD-10-CM

## 2018-10-02 DIAGNOSIS — I10 Essential (primary) hypertension: Secondary | ICD-10-CM | POA: Diagnosis not present

## 2018-10-02 DIAGNOSIS — M25551 Pain in right hip: Secondary | ICD-10-CM | POA: Insufficient documentation

## 2018-10-02 DIAGNOSIS — Z79899 Other long term (current) drug therapy: Secondary | ICD-10-CM | POA: Insufficient documentation

## 2018-10-02 DIAGNOSIS — J45909 Unspecified asthma, uncomplicated: Secondary | ICD-10-CM | POA: Insufficient documentation

## 2018-10-02 MED ORDER — HYDROCODONE-ACETAMINOPHEN 5-325 MG PO TABS: 1.0000 | ORAL_TABLET | Freq: Four times a day (QID) | ORAL | 0 refills | Status: AC | PRN

## 2018-10-02 MED ORDER — PREDNISONE 10 MG PO TABS: 10.0000 mg | ORAL_TABLET | Freq: Every day | ORAL | 0 refills | Status: AC

## 2018-10-02 MED ORDER — HYDROCODONE-ACETAMINOPHEN 5-325 MG PO TABS
1.0000 | ORAL_TABLET | ORAL | Status: AC
  Administered 2018-10-02: 1 via ORAL
  Filled 2018-10-02: qty 1
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
## 2018-10-02 NOTE — Discharge Instructions (Addendum)
Please take medications as prescribed and schedule follow-up appointment with orthopedics.  Return to the ER for any worsening symptoms or urgent changes in health.

## 2018-10-02 NOTE — ED Notes (Signed)
Pt to ed with c/o bilat hip pain for several days.  Denies specific injury.  Pt states right is worse than left.  Pt walking with cane and limp.

## 2018-10-02 NOTE — ED Notes (Signed)
No peripheral IV placed this visit.   Discharge instructions reviewed with patient. Questions fielded by this RN. Patient verbalizes understanding of instructions. Patient discharged home in stable condition per provideer. No acute distress noted at time of discharge.
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
## 2018-10-05 ENCOUNTER — Encounter: Payer: Self-pay | Admitting: Physical Therapy

## 2018-10-05 ENCOUNTER — Ambulatory Visit: Payer: Medicaid Other | Admitting: Physical Therapy

## 2018-10-05 DIAGNOSIS — M25561 Pain in right knee: Secondary | ICD-10-CM | POA: Diagnosis not present

## 2018-10-05 DIAGNOSIS — R490 Dysphonia: Secondary | ICD-10-CM

## 2018-10-05 DIAGNOSIS — R269 Unspecified abnormalities of gait and mobility: Secondary | ICD-10-CM

## 2018-10-05 DIAGNOSIS — M6281 Muscle weakness (generalized): Secondary | ICD-10-CM

## 2018-10-05 DIAGNOSIS — G8929 Other chronic pain: Secondary | ICD-10-CM

## 2018-10-05 DIAGNOSIS — Z9181 History of falling: Secondary | ICD-10-CM

## 2018-10-05 NOTE — Therapy (Signed)
[facility] [address]. [place], [place], [address] Phone: [phone]   Fax:  [phone]  Physical Therapy Treatment  Patient Details  Name: [facility] MRN: [id number] Date of Birth: Jul 13, 1986 Referring Provider (PT): Dr. Nelwyn Salisbury.    Encounter Date: 10/05/2018  PT End of Session - 10/05/18 1113    Visit Number  7    Number of Visits  12    Date for PT Re-Evaluation  11/10/18    PT Start Time  1042    PT Stop Time  1141    PT Time Calculation (min)  59 min    Activity Tolerance  Patient tolerated treatment well;Patient limited by pain    Behavior During Therapy  [facility] for tasks assessed/performed       Past Medical History:  Diagnosis Date  . Asthma   . Environmental allergies   . History of echocardiogram    a. Echo 6/17: EF 60-65%, no RWMA, diastolic function normal, mod LAE  . Hypertension   . OSA (obstructive sleep apnea)    on CPAP    Past Surgical History:  Procedure Laterality Date  . ARTHROSCOPIC REPAIR ACL Right 2005   x2    There were no vitals filed for this visit.  Subjective Assessment - 10/05/18 1040    Subjective  Pt. entered clinic with significant antalgic gait. Pt. reports going to ER on Friday (1/11). Pt. states getting sudden onset of pain in right after taking a step. Pt. states his knee still has some sharp pain but feels like it is improving and is not hurting on the side posterior side.     Limitations  Lifting;Standing;Walking;House hold activities    Diagnostic tests  see imaging/ pt. scheduled for MRI on R hip 09/02/18    Patient Stated Goals  Increase R knee/LE muscle strength and improve pain-free mobility    Currently in Pain?  Yes    Pain Score  3     Pain Location  Knee    Pain Orientation  Right    Pain Descriptors / Indicators  Sharp    Pain Type  Acute pain    Pain Onset  More than a month ago    Pain Frequency  Constant      Treatment  Ther.ex.:  Scifit L4 10  min. B UE/LE (warm-up/ no charge) Side stepping with partial squat in //-bars GTB (cues for proper form) x5 laps Monster walks fwd/bkwd (out of //-bars) x5 laps  Wall squats with swiss ball and small soccer ball for VMO (cues for equal WB) 2x 15 (mirror feedback) TKE GTB (1st set), BTB (2nd set) with mirror feedback 2x 15 Reassessment of MMT (hip flexion, knee ext. Knee flex (seated).) Seated HS stretch 3 x 20sec Bilateral prone HS curls with BTB 2x 15    PT Long Term Goals - 10/05/18 1209      PT LONG TERM GOAL #1   Title  Pt. will increase FOTO to 50 to improve pain-free mobility.      Baseline  initial FOTO: 37.  09/29/18: 39 (no significant changes)    Time  6    Period  Weeks    Status  Not Met    Target Date  11/10/18      PT LONG TERM GOAL #2   Title  Pt. will increase R LE muscle strength to grossly 5/5 MMT (quad/ hip flexion) to improve pain-free mobility.      Baseline  Pt. acheived R LE 5/5 resisted knee extension (with pain)    Time  6    Period  Weeks    Status  Achieved    Target Date  10/05/18      PT LONG TERM GOAL #3   Title  Pt. able to stand from chair/ commode with no UE assist and proper mechanics to improve pain-free mobility.    Baseline  pt. able to stand from rolling stool with no UE assist.     Time  4    Period  Weeks    Status  Achieved      PT LONG TERM GOAL #4   Title  Pt. able to ambulate on outside surfaces with more normalized gait pattern no assistive device safely.      Baseline  Moderate R antalgic gait pattern with or without SPC    Time  6    Period  Weeks    Status  Partially Met    Target Date  11/10/18      PT LONG TERM GOAL #5   Title  Pt. able to complete 30 minutes of ther.ex./ gym based exercise with no increase c/o pain to improve overall muscle endurance/ return to work.     Baseline  fatigues with ther.ex./ currently not working.     Period  Weeks    Status  New    Target Date  11/10/18            Plan -  10/05/18 1114    Clinical Impression Statement  Pt. showed improved equal WB with wall squats without cues from PT. Pt. reported decrease pain by end of session to a 2/10 with "pinchy feeling" during gait. Pt. also improved antalgic gait after the PT session. Pt. tolerated PT strength progression well with minimal pain to no pain. Pt. will benefit from increased strength progression.    Clinical Presentation  Evolving    Clinical Decision Making  Moderate    Rehab Potential  Good    PT Frequency  1x / week    PT Duration  6 weeks    PT Treatment/Interventions  ADLs/Self Care Home Management;Aquatic Therapy;Cryotherapy;Electrical Stimulation;Moist Heat;Gait training;Stair training;Functional mobility training;Therapeutic activities;Therapeutic exercise;Balance training;Patient/family education;Neuromuscular re-education;Manual techniques;Dry needling;Passive range of motion    PT Next Visit Plan  Continue to improve muscular endurance and gait training     PT Home Exercise Plan  see handouts       Patient will benefit from skilled therapeutic intervention in order to improve the following deficits and impairments:  Abnormal gait, Improper body mechanics, Pain, Decreased mobility, Decreased activity tolerance, Decreased endurance, Decreased range of motion, Decreased strength, Hypomobility, Obesity, Impaired flexibility, Difficulty walking, Decreased balance  Visit Diagnosis: Chronic pain of right knee  Muscle weakness (generalized)  Gait difficulty  History of falling  Dysphonia     Problem List Patient Active Problem List   Diagnosis Date Noted  . Chest pain 03/05/2016  . SOB (shortness of breath) 03/05/2016  . Abnormal EKG 03/05/2016   Pura Spice, PT, DPT # [id number] Florentina Jenny, SPT 10/05/2018, 12:19 PM  [facility] [address] [place], [place], [address] Phone: [phone]   Fax:  [phone]  Name: [facility] MRN:  [id number] Date of Birth: 14-Nov-1985

## 2018-10-15 ENCOUNTER — Encounter: Payer: Self-pay | Admitting: Physical Therapy

## 2018-10-15 ENCOUNTER — Ambulatory Visit: Payer: Medicaid Other | Admitting: Physical Therapy

## 2018-10-15 DIAGNOSIS — M6281 Muscle weakness (generalized): Secondary | ICD-10-CM

## 2018-10-15 DIAGNOSIS — Z9181 History of falling: Secondary | ICD-10-CM

## 2018-10-15 DIAGNOSIS — R269 Unspecified abnormalities of gait and mobility: Secondary | ICD-10-CM

## 2018-10-15 DIAGNOSIS — M25561 Pain in right knee: Principal | ICD-10-CM

## 2018-10-15 DIAGNOSIS — G8929 Other chronic pain: Secondary | ICD-10-CM

## 2018-10-15 NOTE — Patient Instructions (Signed)
Access Code: [email or web address]  URL: [email or web address]  Date: 10/15/2018  Prepared by: Dorene Grebe   Exercises  Supine Quad Set - 10 reps - 1 sets - 10 hold - 1x daily - 7x weekly  Supine Active Straight Leg Raise - 20 reps - 1 sets - 1x daily - 7x weekly  Supine Heel Slide - 20 reps - 1 sets - 1x daily - 7x weekly  March in Place - 20 reps - 1 sets - 1x daily - 7x weekly  Standing Hip Abduction with Unilateral Counter Support - 20 reps - 1 sets - 1x daily - 7x weekly  Mini Squat with Chair - 10 reps - 1 sets - 1x daily - 7x weekly  Step Up - 10 reps - 3 sets - 1x daily - 7x weekly  Side Stepping with Resistance at Ankles - 10 reps - 3 sets - 1x daily - 7x weekly  Walking Forward Lunge - 10 reps - 3 sets - 1x daily - 7x weekly

## 2018-10-15 NOTE — Therapy (Signed)
[facility] [address] [place], [place], [address] Phone: [phone]   Fax:  [phone]  Physical Therapy Treatment  Patient Details  Name: Christopher Luna MRN: [id number] Date of Birth: 1986-08-26 Referring Provider (PT): Dr. Nelwyn Salisbury.    Encounter Date: 10/15/2018  PT End of Session - 10/15/18 1129    Visit Number  8    Number of Visits  12    Date for PT Re-Evaluation  11/10/18    PT Start Time  1119    PT Stop Time  1214    PT Time Calculation (min)  55 min    Activity Tolerance  Patient tolerated treatment well;Patient limited by pain    Behavior During Therapy  [facility] for tasks assessed/performed       Past Medical History:  Diagnosis Date  . Asthma   . Environmental allergies   . History of echocardiogram    a. Echo 6/17: EF 60-65%, no RWMA, diastolic function normal, mod LAE  . Hypertension   . OSA (obstructive sleep apnea)    on CPAP    Past Surgical History:  Procedure Laterality Date  . ARTHROSCOPIC REPAIR ACL Right 2005   x2    There were no vitals filed for this visit.  Subjective Assessment - 10/15/18 1120    Subjective  Pt. ambulates into clinic with SPC and antalgic gait. Pt. reports R knee has been "giving way and buckling" more in the past 3 days but today it is not as much; more pain today on medial/lateral sides and patellar tendon. Pt. reports hip is in no pain today but there have been moments where he feels that it "pulls."    Limitations  Lifting;Standing;Walking;House hold activities    Patient Stated Goals  Increase R knee/LE muscle strength and improve pain-free mobility    Currently in Pain?  Yes    Pain Score  7     Pain Location  Knee    Pain Orientation  Right    Pain Descriptors / Indicators  Sharp    Pain Type  Acute pain    Pain Onset  More than a month ago    Pain Frequency  Intermittent        There.ex.:  Scifit L6. 10 min. B UE/LE (warm-up/ no charge) HS and flexion  stretch on stairs Step ups 6 inch 2x10 (cues for slow quad control and less UE assist) Partial walking lunges in //-bars 5x laps (modified range to decrease pain) High marching in //-bars  (for incr. Knee/hip flexion and exaggerating heel strike) Side stepping in //-bars with partial lunge BTB Wall squats with swiss ball 2x10  Supine HS curls with swiss ball 20x  Walking in PT clinic with cuing for proper BOS/ step pattern/ heel strike     PT Long Term Goals - 10/05/18 1209      PT LONG TERM GOAL #1   Title  Pt. will increase FOTO to 50 to improve pain-free mobility.      Baseline  initial FOTO: 37.  09/29/18: 39 (no significant changes)    Time  6    Period  Weeks    Status  Not Met    Target Date  11/10/18      PT LONG TERM GOAL #2   Title  Pt. will increase R LE muscle strength to grossly 5/5 MMT (quad/ hip flexion) to improve pain-free mobility.      Baseline  Pt. acheived R LE  5/5 resisted knee extension (with pain)    Time  6    Period  Weeks    Status  Achieved    Target Date  10/05/18      PT LONG TERM GOAL #3   Title  Pt. able to stand from chair/ commode with no UE assist and proper mechanics to improve pain-free mobility.    Baseline  pt. able to stand from rolling stool with no UE assist.     Time  4    Period  Weeks    Status  Achieved      PT LONG TERM GOAL #4   Title  Pt. able to ambulate on outside surfaces with more normalized gait pattern no assistive device safely.      Baseline  Moderate R antalgic gait pattern with or without SPC    Time  6    Period  Weeks    Status  Partially Met    Target Date  11/10/18      PT LONG TERM GOAL #5   Title  Pt. able to complete 30 minutes of ther.ex./ gym based exercise with no increase c/o pain to improve overall muscle endurance/ return to work.     Baseline  fatigues with ther.ex./ currently not working.     Period  Weeks    Status  New    Target Date  11/10/18        Patient will benefit from skilled  therapeutic intervention in order to improve the following deficits and impairments:  Abnormal gait, Improper body mechanics, Pain, Decreased mobility, Decreased activity tolerance, Decreased endurance, Decreased range of motion, Decreased strength, Hypomobility, Obesity, Impaired flexibility, Difficulty walking, Decreased balance  Visit Diagnosis: Chronic pain of right knee  Muscle weakness (generalized)  Gait difficulty  History of falling    Problem List Patient Active Problem List   Diagnosis Date Noted  . Chest pain 03/05/2016  . SOB (shortness of breath) 03/05/2016  . Abnormal EKG 03/05/2016   Pura Spice, PT, DPT # [id number] [place], [place] 10/16/2018, 10:36 AM  [place] [facility] [address] [place], [place], [address] Phone: [phone]   Fax:  [phone]  Name: Christopher Luna MRN: [id number] Date of Birth: August 27, 1986

## 2018-10-22 ENCOUNTER — Ambulatory Visit: Payer: Medicaid Other | Admitting: Physical Therapy

## 2018-10-29 ENCOUNTER — Ambulatory Visit: Payer: Medicaid Other | Attending: Physician Assistant | Admitting: Physical Therapy

## 2018-10-29 ENCOUNTER — Encounter: Payer: Self-pay | Admitting: Physical Therapy

## 2018-10-29 DIAGNOSIS — M25561 Pain in right knee: Secondary | ICD-10-CM | POA: Diagnosis not present

## 2018-10-29 DIAGNOSIS — Z9181 History of falling: Secondary | ICD-10-CM

## 2018-10-29 DIAGNOSIS — M6281 Muscle weakness (generalized): Secondary | ICD-10-CM | POA: Insufficient documentation

## 2018-10-29 DIAGNOSIS — G8929 Other chronic pain: Secondary | ICD-10-CM | POA: Diagnosis present

## 2018-10-29 DIAGNOSIS — R269 Unspecified abnormalities of gait and mobility: Secondary | ICD-10-CM | POA: Diagnosis present

## 2018-10-29 NOTE — Therapy (Signed)
[place] [facility] [address] [place], [place], [address] Phone: [phone]   Fax:  [phone]  Physical Therapy Treatment  Patient Details  Name: Christopher Luna MRN: [id number] Date of Birth: 12-06-1985 Referring Provider (PT): Dr. Nelwyn Salisbury.    Encounter Date: 10/29/2018  PT End of Session - 10/29/18 1036    Visit Number  9    Number of Visits  12    Date for PT Re-Evaluation  11/10/18    PT Start Time  0826    PT Stop Time  0921    PT Time Calculation (min)  55 min    Activity Tolerance  Patient tolerated treatment well;Patient limited by pain    Behavior During Therapy  [facility] for tasks assessed/performed       Past Medical History:  Diagnosis Date  . Asthma   . Environmental allergies   . History of echocardiogram    a. Echo 6/17: EF 60-65%, no RWMA, diastolic function normal, mod LAE  . Hypertension   . OSA (obstructive sleep apnea)    on CPAP    Past Surgical History:  Procedure Laterality Date  . ARTHROSCOPIC REPAIR ACL Right 2005   x2    There were no vitals filed for this visit.  Subjective Assessment - 10/29/18 1034    Subjective  Pt. ambulates into clinic without AD and slight R antalgic gait. Pt. reports constant low hip pain and sharp pains in R knee. Pt. stated that he has done his new HEP but was sick the past few days so he has not been up on his knee much. Pt. states he has not been ambulating with SPC.    Limitations  Lifting;Standing;Walking;House hold activities    Diagnostic tests  see imaging/ pt. scheduled for MRI on R hip 09/02/18    Patient Stated Goals  Increase R knee/LE muscle strength and improve pain-free mobility    Currently in Pain?  Yes    Pain Score  6     Pain Location  Knee    Pain Orientation  Right    Pain Descriptors / Indicators  Sharp    Pain Onset  More than a month ago        There.ex.:  Scifit L6. 10 min. B UE/LE (warm-up/ no charge) HS and flexion stretch on  stairs Step ups 6 inch 2x10 (cues for slow quad control and less UE assist) Step backs onto 6 inch step 5x (stopped due to incr. Pain and L LE compensation) Wall squats with swiss ball 2x10  Standing HS curls 20x  Supine bolster bridges 2x20 Supine SAQ with manual resistance from PT 2x15 Ambulating in PT clinic/hallway with cuing for proper BOS/ step pattern/ heel strike     PT Long Term Goals - 10/05/18 1209      PT LONG TERM GOAL #1   Title  Pt. will increase FOTO to 50 to improve pain-free mobility.      Baseline  initial FOTO: 37.  09/29/18: 39 (no significant changes)    Time  6    Period  Weeks    Status  Not Met    Target Date  11/10/18      PT LONG TERM GOAL #2   Title  Pt. will increase R LE muscle strength to grossly 5/5 MMT (quad/ hip flexion) to improve pain-free mobility.      Baseline  Pt. acheived R LE 5/5 resisted knee extension (with pain)  Time  6    Period  Weeks    Status  Achieved    Target Date  10/05/18      PT LONG TERM GOAL #3   Title  Pt. able to stand from chair/ commode with no UE assist and proper mechanics to improve pain-free mobility.    Baseline  pt. able to stand from rolling stool with no UE assist.     Time  4    Period  Weeks    Status  Achieved      PT LONG TERM GOAL #4   Title  Pt. able to ambulate on outside surfaces with more normalized gait pattern no assistive device safely.      Baseline  Moderate R antalgic gait pattern with or without SPC    Time  6    Period  Weeks    Status  Partially Met    Target Date  11/10/18      PT LONG TERM GOAL #5   Title  Pt. able to complete 30 minutes of ther.ex./ gym based exercise with no increase c/o pain to improve overall muscle endurance/ return to work.     Baseline  fatigues with ther.ex./ currently not working.     Period  Weeks    Status  New    Target Date  11/10/18            Plan - 10/29/18 1041    Clinical Impression Statement  Pt. ambulates into clinic with no AD and  slight R antalgic gait with decr. heel strike and ER LE. Pt. demonstrated decr. WB on R LE during wall squats and needed verbal cueing for equal ROM and modified knee flexion in order to decr. pain. Pt. felt sharp knee pains on lateral and medial side of R knee throughout all therex so PT educated pt on the importance of continued strengthening but letting pain guide ROM and not going past the point of incr. pain. Pt. needs mod verbal cueing throughout therex to decr. ER of both LE and to incr. heel strike when ambulating. Pt. will continue to benefit from skilled PT services in order to improve R LE strength and decr. pain to be able to return to normal ADLs with pain-free mobility.    Clinical Presentation  Evolving    Clinical Decision Making  Moderate    Rehab Potential  Good    PT Frequency  1x / week    PT Duration  6 weeks    PT Treatment/Interventions  ADLs/Self Care Home Management;Aquatic Therapy;Cryotherapy;Electrical Stimulation;Moist Heat;Gait training;Stair training;Functional mobility training;Therapeutic activities;Therapeutic exercise;Balance training;Patient/family education;Neuromuscular re-education;Manual techniques;Dry needling;Passive range of motion    PT Next Visit Plan  Progression of B LE strengthening and improving endurance throughout session    PT Home Exercise Plan  see handouts       Patient will benefit from skilled therapeutic intervention in order to improve the following deficits and impairments:  Abnormal gait, Improper body mechanics, Pain, Decreased mobility, Decreased activity tolerance, Decreased endurance, Decreased range of motion, Decreased strength, Hypomobility, Obesity, Impaired flexibility, Difficulty walking, Decreased balance  Visit Diagnosis: Chronic pain of right knee  Muscle weakness (generalized)  Gait difficulty  History of falling  Dysphonia     Problem List Patient Active Problem List   Diagnosis Date Noted  . Chest pain  03/05/2016  . SOB (shortness of breath) 03/05/2016  . Abnormal EKG 03/05/2016   Pura Spice, PT, DPT # [id number] [place], [place]  10/29/2018, 10:46 AM  [facility] [address] [place], [place], [address] Phone: [phone]   Fax:  [phone]  Name: Christopher Luna MRN: [id number] Date of Birth: [birth year]

## 2018-11-05 ENCOUNTER — Ambulatory Visit: Payer: Medicaid Other | Admitting: Physical Therapy

## 2018-11-09 ENCOUNTER — Ambulatory Visit: Payer: Medicaid Other | Admitting: Physical Therapy

## 2018-11-09 DIAGNOSIS — Z9181 History of falling: Secondary | ICD-10-CM

## 2018-11-09 DIAGNOSIS — M25561 Pain in right knee: Principal | ICD-10-CM

## 2018-11-09 DIAGNOSIS — G8929 Other chronic pain: Secondary | ICD-10-CM

## 2018-11-09 DIAGNOSIS — M6281 Muscle weakness (generalized): Secondary | ICD-10-CM

## 2018-11-09 DIAGNOSIS — R269 Unspecified abnormalities of gait and mobility: Secondary | ICD-10-CM

## 2018-11-09 NOTE — Therapy (Deleted)
[facility] [address] [place], [place], [address] Phone: [phone]   Fax:  [phone]  Physical Therapy Treatment  Patient Details  Name: Lief Palmatier MRN: [id number] Date of Birth: 01-13-1986 Referring Provider (PT): Dr. Nelwyn Salisbury.    Encounter Date: 11/09/2018  PT End of Session - 11/09/18 1529    Visit Number  10    Number of Visits  12    Date for PT Re-Evaluation  11/10/18    PT Start Time  1420    Activity Tolerance  Patient tolerated treatment well;Patient limited by pain    Behavior During Therapy  [facility] for tasks assessed/performed       Past Medical History:  Diagnosis Date  . Asthma   . Environmental allergies   . History of echocardiogram    a. Echo 6/17: EF 60-65%, no RWMA, diastolic function normal, mod LAE  . Hypertension   . OSA (obstructive sleep apnea)    on CPAP    Past Surgical History:  Procedure Laterality Date  . ARTHROSCOPIC REPAIR ACL Right 2005   x2    There were no vitals filed for this visit.  Subjective Assessment - 11/09/18 1523    Subjective  Pt in clinic with [facility] today reporting 7/10 pain in R hip without provocation.  Stated that the pain started Thursday.  He has not been taking any medication for the pain.      Limitations  Lifting;Standing;Walking;House hold activities    Diagnostic tests  see imaging/ pt. scheduled for MRI on R hip 09/02/18    Patient Stated Goals  Increase R knee/LE muscle strength and improve pain-free mobility    Pain Onset  More than a month ago       Therapeutic exercise  Sci fit L: 3.9, 10 min  Standing HS stretch at stairs 2x30 sec bilaterally Supine: Bridge x10  SAQ 2x15, 4 lbs  Gait analysis: Antalgic gait avoiding heel strike on R LE; knee buckling when pt attempts to heel strike R knee assessment: Standing at parallel bars: pain with hip abduction, ankle DF Supine on table: bridge, knee flexion, palpation of HS, IT band, quad tendon, HS  length, gastroc length, knee joint a-p,   Sidelying hip abduction with VC's for R LE placement and proper form   Pt reported high pain level in R knee which limited him in all there ex with the exception of SAQ's and bridge exercises.  Time taken today to assess R knee for pain source and to modify exercise program for today.  Pt reported multiple pain sites and pain with WB, supine HS stretch and                                  PT Long Term Goals - 10/05/18 1209      PT LONG TERM GOAL #1   Title  Pt. will increase FOTO to 50 to improve pain-free mobility.      Baseline  initial FOTO: 37.  09/29/18: 39 (no significant changes)    Time  6    Period  Weeks    Status  Not Met    Target Date  11/10/18      PT LONG TERM GOAL #2   Title  Pt. will increase R LE muscle strength to grossly 5/5 MMT (quad/ hip flexion) to improve pain-free mobility.      Baseline  Pt.  acheived R LE 5/5 resisted knee extension (with pain)    Time  6    Period  Weeks    Status  Achieved    Target Date  10/05/18      PT LONG TERM GOAL #3   Title  Pt. able to stand from chair/ commode with no UE assist and proper mechanics to improve pain-free mobility.    Baseline  pt. able to stand from rolling stool with no UE assist.     Time  4    Period  Weeks    Status  Achieved      PT LONG TERM GOAL #4   Title  Pt. able to ambulate on outside surfaces with more normalized gait pattern no assistive device safely.      Baseline  Moderate R antalgic gait pattern with or without SPC    Time  6    Period  Weeks    Status  Partially Met    Target Date  11/10/18      PT LONG TERM GOAL #5   Title  Pt. able to complete 30 minutes of ther.ex./ gym based exercise with no increase c/o pain to improve overall muscle endurance/ return to work.     Baseline  fatigues with ther.ex./ currently not working.     Period  Weeks    Status  New    Target Date  11/10/18              Patient will  benefit from skilled therapeutic intervention in order to improve the following deficits and impairments:     Visit Diagnosis: Chronic pain of right knee  Muscle weakness (generalized)  Gait difficulty  History of falling     Problem List Patient Active Problem List   Diagnosis Date Noted  . Chest pain 03/05/2016  . SOB (shortness of breath) 03/05/2016  . Abnormal EKG 03/05/2016    Roxanne Gates 11/09/2018, 3:34 PM  [place] [facility] [address] [place], [place], [address] Phone: [phone]   Fax:  [phone]  Name: Nelton Amsden MRN: [id number] Date of Birth: [birth year]

## 2018-11-09 NOTE — Therapy (Signed)
[place] [facility] [address] [place], [place], [address] Phone: [phone]   Fax:  [phone]  Physical Therapy Treatment  Patient Details  Name: Christopher Luna MRN: [id number] Date of Birth: 07-27-1986 Referring Provider (PT): Dr. Nelwyn Salisbury.    Encounter Date: 11/09/2018  PT End of Session - 11/09/18 1529    Visit Number  10    Number of Visits  12    Date for PT Re-Evaluation  11/10/18    PT Start Time  3335    PT Stop Time  1517    PT Time Calculation (min)  57 min    Activity Tolerance  Patient tolerated treatment well;Patient limited by pain    Behavior During Therapy  [facility] for tasks assessed/performed       Past Medical History:  Diagnosis Date  . Asthma   . Environmental allergies   . History of echocardiogram    a. Echo 6/17: EF 60-65%, no RWMA, diastolic function normal, mod LAE  . Hypertension   . OSA (obstructive sleep apnea)    on CPAP    Past Surgical History:  Procedure Laterality Date  . ARTHROSCOPIC REPAIR ACL Right 2005   x2    There were no vitals filed for this visit.  Subjective Assessment - 11/09/18 1523    Subjective  Pt in clinic with [facility] today reporting 7/10 pain in R hip without provocation.  Stated that the pain started Thursday.  He has not been taking any medication for the pain.      Limitations  Lifting;Standing;Walking;House hold activities    Diagnostic tests  see imaging/ pt. scheduled for MRI on R hip 09/02/18    Patient Stated Goals  Increase R knee/LE muscle strength and improve pain-free mobility    Currently in Pain?  Yes    Pain Score  7     Pain Location  Hip    Pain Orientation  Right    Pain Onset  More than a month ago       Therapeutic exercise  Sci fit L: 3.9, 10 min (not charged)   Standing HS stretch at stairs 2x30 sec bilaterally Supine: Bridge x10 (PT provide min verbal cueing to achieve full neutral hip extension) SAQ 2x15, 4 lbs TG squats with knee adduction  2x15 (PT provided min verbal cueing to maintain alignment of knee over toes)  TG calf raises 2x15 (pt. Had no increase in pain) Wall squats with swiss ball 3x15 and ball adduction (PT provided mod verbal cueing to keep hips centered over toes)  Resisted standing total knee extension on right leg with GTB 2x12 (pt. tolerated without increase in pain)    R knee assessment:   Standing at parallel bars: pain with hip abduction, ankle DF Supine on table: bridge, knee flexion, palpation of HS, IT band, quad tendon, HS length, gastroc length, knee joint a-p,   Gait analysis: Antalgic gait avoiding heel strike on R LE; knee buckling when pt attempts to heel strike Sidelying hip abduction with VC's for R LE placement and proper form      PT Long Term Goals - 10/05/18 1209      PT LONG TERM GOAL #1   Title  Pt. will increase FOTO to 50 to improve pain-free mobility.      Baseline  initial FOTO: 37.  09/29/18: 39 (no significant changes)    Time  6    Period  Weeks    Status  Not Met  Target Date  11/10/18      PT LONG TERM GOAL #2   Title  Pt. will increase R LE muscle strength to grossly 5/5 MMT (quad/ hip flexion) to improve pain-free mobility.      Baseline  Pt. acheived R LE 5/5 resisted knee extension (with pain)    Time  6    Period  Weeks    Status  Achieved    Target Date  10/05/18      PT LONG TERM GOAL #3   Title  Pt. able to stand from chair/ commode with no UE assist and proper mechanics to improve pain-free mobility.    Baseline  pt. able to stand from rolling stool with no UE assist.     Time  4    Period  Weeks    Status  Achieved      PT LONG TERM GOAL #4   Title  Pt. able to ambulate on outside surfaces with more normalized gait pattern no assistive device safely.      Baseline  Moderate R antalgic gait pattern with or without SPC    Time  6    Period  Weeks    Status  Partially Met    Target Date  11/10/18      PT LONG TERM GOAL #5   Title  Pt. able to  complete 30 minutes of ther.ex./ gym based exercise with no increase c/o pain to improve overall muscle endurance/ return to work.     Baseline  fatigues with ther.ex./ currently not working.     Period  Weeks    Status  New    Target Date  11/10/18            Plan - 11/09/18 1644    Clinical Impression Statement  Pt reported high pain level in R knee which limited him in all there ex with the exception of SAQ's and bridge exercises.  Time taken today to assess R knee for pain source and to modify exercise program for today.  Pt reported multiple pain sites and pain with WB and during heel strike of gait. PT provided mod verbal cueing to maintain proper knee alignment over toes during squats and to not move into painful ROM. Pt. Educated to use ice at home to decrease pain and to continue HEP as prescribed without increasing pain beyond what is tolerable. Pt. will continue to benefit form skilled PT intervention to decrease pain and increase strength to decrease fall risk and be able to return to work and functional activity without pain.     Clinical Presentation  Evolving    Clinical Decision Making  Moderate    Rehab Potential  Good    PT Frequency  1x / week    PT Duration  6 weeks    PT Treatment/Interventions  ADLs/Self Care Home Management;Aquatic Therapy;Cryotherapy;Electrical Stimulation;Moist Heat;Gait training;Stair training;Functional mobility training;Therapeutic activities;Therapeutic exercise;Balance training;Patient/family education;Neuromuscular re-education;Manual techniques;Dry needling;Passive range of motion    PT Next Visit Plan  Progression of B LE strengthening and improving endurance throughout session    PT Home Exercise Plan  see handouts       Patient will benefit from skilled therapeutic intervention in order to improve the following deficits and impairments:  Abnormal gait, Improper body mechanics, Pain, Decreased mobility, Decreased activity tolerance,  Decreased endurance, Decreased range of motion, Decreased strength, Hypomobility, Obesity, Impaired flexibility, Difficulty walking, Decreased balance  Visit Diagnosis: Chronic pain of right knee  Muscle weakness (generalized)  Gait difficulty  History of falling     Problem List Patient Active Problem List   Diagnosis Date Noted  . Chest pain 03/05/2016  . SOB (shortness of breath) 03/05/2016  . Abnormal EKG 03/05/2016   Pura Spice, PT, DPT # [place], SPT 11/10/2018, 5:13 PM  [place] [facility] [address] [place], [place], [address] Phone: [phone]   Fax:  [phone]  Name: Christopher Luna MRN: [id number] Date of Birth: Apr 03, 1986

## 2018-11-12 ENCOUNTER — Ambulatory Visit: Payer: Medicaid Other | Admitting: Physical Therapy

## 2018-11-19 ENCOUNTER — Encounter: Payer: Self-pay | Admitting: Physical Therapy

## 2018-11-19 ENCOUNTER — Ambulatory Visit: Payer: Medicaid Other | Admitting: Physical Therapy

## 2018-11-19 DIAGNOSIS — Z9181 History of falling: Secondary | ICD-10-CM

## 2018-11-19 DIAGNOSIS — R269 Unspecified abnormalities of gait and mobility: Secondary | ICD-10-CM

## 2018-11-19 DIAGNOSIS — G8929 Other chronic pain: Secondary | ICD-10-CM

## 2018-11-19 DIAGNOSIS — M25561 Pain in right knee: Secondary | ICD-10-CM | POA: Diagnosis not present

## 2018-11-19 DIAGNOSIS — M6281 Muscle weakness (generalized): Secondary | ICD-10-CM

## 2018-11-19 NOTE — Therapy (Signed)
[facility] [address] [place], [place], [address] Phone: [phone]   Fax:  [phone]  Physical Therapy Treatment  Patient Details  Name: Christopher Luna MRN: [id number] Date of Birth: [birth year] Referring Provider (PT): Dr. Nelwyn Salisbury.    Encounter Date: 11/19/2018  PT End of Session - 11/19/18 0846    Visit Number  11    Number of Visits  17    Date for PT Re-Evaluation  12/31/18    PT Start Time  0838    PT Stop Time  0944    PT Time Calculation (min)  66 min    Activity Tolerance  Patient tolerated treatment well;Patient limited by pain    Behavior During Therapy  [facility] for tasks assessed/performed       Past Medical History:  Diagnosis Date  . Asthma   . Environmental allergies   . History of echocardiogram    a. Echo 6/17: EF 60-65%, no RWMA, diastolic function normal, mod LAE  . Hypertension   . OSA (obstructive sleep apnea)    on CPAP    Past Surgical History:  Procedure Laterality Date  . ARTHROSCOPIC REPAIR ACL Right 2005   x2    There were no vitals filed for this visit.  Subjective Assessment - 11/19/18 0842    Subjective  Pt. ambulates into clinic without AD and reports his knee pain has been up and down but today is a good day. Pt. states that he has only used SPC one day last week and has been doing squats and sit to stands at home without pain but "that is all he can do." Pt. states that his hip has not been bothering him but his ankles feel unsteady and painful when walking on uneven terrain. Pt. states his next MD appt is 3/5.    Limitations  Lifting;Standing;Walking;House hold activities    Diagnostic tests  see imaging/ pt. scheduled for MRI on R hip 09/02/18    Patient Stated Goals  Increase R knee/LE muscle strength and improve pain-free mobility    Currently in Pain?  Yes    Pain Score  3     Pain Location  Knee    Pain Orientation  Right    Pain Descriptors / Indicators  Aching    Pain  Onset  More than a month ago       Treatment:  Therex:  Scifit L7 10 min Stair reassessment (pain still reported on posterior R knee with stair ambulation) Step ups B 15x each (cues for eccentric quad activation/ control) Calf raises off stairs 2x15 Wall squats 10x with 5-10 sec holds (significant weakness noted when standing from hold and fatigue) Balance assessment on stable surface (signifcant B ankle instability noted)  SLB Airex 2x30 sec for incr. Ankle stability Step lunge to BOSU with 5 sec hold  Monster walks: fwd/ bkwd/ lateral 5 laps each BTB Reviewed HEP Discussed benefits of gym based ex.    PT Education - 11/19/18 0949    Education Details  possible gym program, balance activities to incr. ankle stability    Person(s) Educated  Patient    Methods  Explanation;Demonstration    Comprehension  Verbalized understanding;Returned demonstration         PT Long Term Goals - 11/19/18 0952      PT LONG TERM GOAL #1   Title  Pt. will increase FOTO to 50 to improve pain-free mobility.      Baseline  initial  FOTO: 37.  09/29/18: 39 (no significant changes) 2/28: 49    Time  4    Period  Weeks    Status  Partially Met    Target Date  12/31/18      PT LONG TERM GOAL #2   Title  Pt. will increase R LE muscle strength to grossly 5/5 MMT (quad/ hip flexion) to improve pain-free mobility.      Baseline  Pt. acheived R LE 5/5 resisted knee extension (with pain)    Time  6    Period  Weeks    Status  Achieved    Target Date  10/05/18      PT LONG TERM GOAL #3   Title  Pt. able to stand from chair/ commode with no UE assist and proper mechanics to improve pain-free mobility.    Baseline  pt. able to stand from rolling stool with no UE assist.     Time  4    Period  Weeks    Status  Achieved    Target Date  09/29/18      PT LONG TERM GOAL #4   Title  Pt. able to ambulate on outside surfaces with more normalized gait pattern no assistive device safely.      Baseline  Pt.  reports pain on uneven terrain and ambulates with mod. antalgic and wide BOS gait without AD    Time  4    Period  Weeks    Status  Partially Met    Target Date  12/17/18      PT LONG TERM GOAL #5   Title  Pt. able to complete 30 minutes of ther.ex./ gym based exercise with no increase c/o pain to improve overall muscle endurance/ return to work.     Baseline  Pt. still fatigues easily with therex and has not been able to complete full 30 min gym workout without fatigue/ pain. Pt. still not working.    Time  4    Period  Weeks    Status  Not Met    Target Date  12/31/18         Plan - 11/19/18 1002    Clinical Impression Statement  Pt. ambulates into clinic with no AD and decr. knee/ hip pain. Pt. still demonstrates mod. antaglic gait pattern with wide BOS and decr. heel strike and needs mod. VCs throughout clinic ambulation to promote normal gait pattern. Pt. reports incr. pain with stair assessment on R posterior knee. Pt. demonstrates significant weakness during wall squats hold with modified knee flexion ROM to decr. pain. Pt. also has decr. ankle stability and PT progressed his balance activity to SLB on Airex to promote intrinsic B ankle firing and neuromuscular reeducation. Pt. was educated on the importance of ankle stability when ambulating and in regards to knee/ hip rehab and was encouraged to add balance into HEP. Pt. was also educated on possibility of joining a gym after pt. expressed concerns around his weight. Pt. still has significant muscle fatigue by end of session in which he had one LOB during last therex (monster walks). Pt. will continue to benefit from skilled PT services in order to improve B LE muscle endurance/ ankle stability in order to promote pain-free ADLs and return to work.     Clinical Presentation  Evolving    Clinical Decision Making  Moderate    Rehab Potential  Good    PT Frequency  1x / week    PT Duration  6 weeks    PT Treatment/Interventions   ADLs/Self Care Home Management;Aquatic Therapy;Cryotherapy;Electrical Stimulation;Moist Heat;Gait training;Stair training;Functional mobility training;Therapeutic activities;Therapeutic exercise;Balance training;Patient/family education;Neuromuscular re-education;Manual techniques;Dry needling;Passive range of motion    PT Next Visit Plan  Progression of B LE strengthening, ankle control and improving endurance throughout session    PT Home Exercise Plan  see handouts    Consulted and Agree with Plan of Care  Patient       Patient will benefit from skilled therapeutic intervention in order to improve the following deficits and impairments:  Abnormal gait, Improper body mechanics, Pain, Decreased mobility, Decreased activity tolerance, Decreased endurance, Decreased range of motion, Decreased strength, Hypomobility, Obesity, Impaired flexibility, Difficulty walking, Decreased balance  Visit Diagnosis: Chronic pain of right knee  Muscle weakness (generalized)  Gait difficulty  History of falling     Problem List Patient Active Problem List   Diagnosis Date Noted  . Chest pain 03/05/2016  . SOB (shortness of breath) 03/05/2016  . Abnormal EKG 03/05/2016   Pura Spice, PT, DPT # [id number] [place], [place] 11/20/2018, 8:55 AM  [place] [facility] [address] [place], [place], [address] Phone: [phone]   Fax:  [phone]  Name: Christopher Luna MRN: [id number] Date of Birth: 16-Jul-1986

## 2018-11-20 NOTE — Addendum Note (Signed)
Addended by: Cammie Mcgee on: 11/20/2018 08:59 AM   Modules accepted: Orders

## 2018-11-24 ENCOUNTER — Ambulatory Visit
Admission: EM | Admit: 2018-11-24 | Discharge: 2018-11-24 | Disposition: A | Discharge status: Other Acute Inpatient Hospital | Payer: Medicaid Other | Attending: Family Medicine | Admitting: Family Medicine

## 2018-11-24 ENCOUNTER — Emergency Department
Admission: EM | Admit: 2018-11-24 | Discharge: 2018-11-24 | Disposition: A | Discharge status: Home / Self Care | Payer: Medicaid Other | Attending: Emergency Medicine | Admitting: Emergency Medicine

## 2018-11-24 ENCOUNTER — Ambulatory Visit: Payer: Medicaid Other | Attending: Physician Assistant | Admitting: Physical Therapy

## 2018-11-24 ENCOUNTER — Encounter: Payer: Self-pay | Admitting: *Deleted

## 2018-11-24 ENCOUNTER — Other Ambulatory Visit: Payer: Self-pay

## 2018-11-24 ENCOUNTER — Encounter: Payer: Self-pay | Admitting: Physical Therapy

## 2018-11-24 ENCOUNTER — Emergency Department: Payer: Medicaid Other

## 2018-11-24 DIAGNOSIS — M25561 Pain in right knee: Secondary | ICD-10-CM | POA: Diagnosis present

## 2018-11-24 DIAGNOSIS — I10 Essential (primary) hypertension: Secondary | ICD-10-CM | POA: Diagnosis not present

## 2018-11-24 DIAGNOSIS — R269 Unspecified abnormalities of gait and mobility: Secondary | ICD-10-CM | POA: Diagnosis present

## 2018-11-24 DIAGNOSIS — J189 Pneumonia, unspecified organism: Secondary | ICD-10-CM | POA: Insufficient documentation

## 2018-11-24 DIAGNOSIS — R079 Chest pain, unspecified: Secondary | ICD-10-CM | POA: Diagnosis not present

## 2018-11-24 DIAGNOSIS — I161 Hypertensive emergency: Secondary | ICD-10-CM | POA: Diagnosis not present

## 2018-11-24 DIAGNOSIS — M6281 Muscle weakness (generalized): Secondary | ICD-10-CM | POA: Diagnosis present

## 2018-11-24 DIAGNOSIS — Z9181 History of falling: Secondary | ICD-10-CM | POA: Diagnosis present

## 2018-11-24 DIAGNOSIS — Z79899 Other long term (current) drug therapy: Secondary | ICD-10-CM | POA: Insufficient documentation

## 2018-11-24 DIAGNOSIS — J45909 Unspecified asthma, uncomplicated: Secondary | ICD-10-CM | POA: Diagnosis not present

## 2018-11-24 DIAGNOSIS — G8929 Other chronic pain: Secondary | ICD-10-CM | POA: Diagnosis present

## 2018-11-24 DIAGNOSIS — R0602 Shortness of breath: Secondary | ICD-10-CM | POA: Diagnosis present

## 2018-11-24 LAB — INFLUENZA PANEL BY PCR (TYPE A & B)
Influenza A By PCR: NEGATIVE
Influenza B By PCR: NEGATIVE

## 2018-11-24 LAB — CBC WITH DIFFERENTIAL/PLATELET
Abs Immature Granulocytes: 0.04 10*3/uL (ref 0.00–0.07)
Basophils Absolute: 0.1 10*3/uL (ref 0.0–0.1)
Basophils Relative: 1 %
EOS PCT: 4 %
Eosinophils Absolute: 0.5 10*3/uL (ref 0.0–0.5)
HCT: 44.5 % (ref 39.0–52.0)
Hemoglobin: 14.5 g/dL (ref 13.0–17.0)
Immature Granulocytes: 0 %
Lymphocytes Relative: 21 %
Lymphs Abs: 2.5 10*3/uL (ref 0.7–4.0)
MCH: 27.5 pg (ref 26.0–34.0)
MCHC: 32.6 g/dL (ref 30.0–36.0)
MCV: 84.3 fL (ref 80.0–100.0)
Monocytes Absolute: 1.1 10*3/uL — ABNORMAL HIGH (ref 0.1–1.0)
Monocytes Relative: 9 %
NRBC: 0 % (ref 0.0–0.2)
Neutro Abs: 7.7 10*3/uL (ref 1.7–7.7)
Neutrophils Relative %: 65 %
Platelets: 290 10*3/uL (ref 150–400)
RBC: 5.28 MIL/uL (ref 4.22–5.81)
RDW: 13.4 % (ref 11.5–15.5)
WBC: 11.9 10*3/uL — ABNORMAL HIGH (ref 4.0–10.5)

## 2018-11-24 LAB — COMPREHENSIVE METABOLIC PANEL
ALK PHOS: 58 U/L (ref 38–126)
ALT: 34 U/L (ref 0–44)
ANION GAP: 10 (ref 5–15)
AST: 30 U/L (ref 15–41)
Albumin: 4 g/dL (ref 3.5–5.0)
BUN: 10 mg/dL (ref 6–20)
CO2: 30 mmol/L (ref 22–32)
Calcium: 8.8 mg/dL — ABNORMAL LOW (ref 8.9–10.3)
Chloride: 101 mmol/L (ref 98–111)
Creatinine, Ser: 1.2 mg/dL (ref 0.61–1.24)
GFR calc Af Amer: >60 mL/min (ref >60)
GFR calc non Af Amer: >60 mL/min (ref >60)
Glucose, Bld: 97 mg/dL (ref 70–99)
Potassium: 3.9 mmol/L (ref 3.5–5.1)
Sodium: 141 mmol/L (ref 135–145)
Total Bilirubin: 1 mg/dL (ref 0.3–1.2)
Total Protein: 7.4 g/dL (ref 6.5–8.1)

## 2018-11-24 LAB — LIPASE, BLOOD: Lipase: 23 U/L (ref 11–51)

## 2018-11-24 LAB — TROPONIN I: Troponin I: <0.03 ng/mL (ref <0.03)

## 2018-11-24 MED ORDER — AZITHROMYCIN 250 MG PO TABS: ORAL_TABLET | ORAL | 0 refills | Status: AC

## 2018-11-24 MED ORDER — SODIUM CHLORIDE 0.9 % IV SOLN
1.0000 g | Freq: Once | INTRAVENOUS | Status: AC
  Administered 2018-11-24: 1 g via INTRAVENOUS
  Filled 2018-11-24: qty 10

## 2018-11-24 MED ORDER — SODIUM CHLORIDE 0.9 % IV BOLUS
1000.0000 mL | Freq: Once | INTRAVENOUS | Status: AC
  Administered 2018-11-24: 1000 mL via INTRAVENOUS

## 2018-11-24 MED ORDER — KETOROLAC TROMETHAMINE 30 MG/ML IJ SOLN
30.0000 mg | Freq: Once | INTRAMUSCULAR | Status: AC
Start: 2018-11-24 — End: 2018-11-24
  Administered 2018-11-24: 30 mg via INTRAVENOUS
  Filled 2018-11-24: qty 1

## 2018-11-24 NOTE — ED Notes (Signed)
Patient states that he has not taken any of his medications this week do to not feeling well.

## 2018-11-24 NOTE — Therapy (Signed)
[facility] [address] [place], [place], [address] Phone: [phone]   Fax:  [phone]  Physical Therapy Treatment  Patient Details  Name: Christopher Luna MRN: [id number] Date of Birth: Jul 24, 1986 Referring Provider (PT): Dr. Nelwyn Salisbury.    Encounter Date: 11/24/2018  PT End of Session - 11/28/18 0851    Visit Number  12    Number of Visits  17    Date for PT Re-Evaluation  12/31/18    PT Start Time  0825    PT Stop Time  0929    PT Time Calculation (min)  64 min    Activity Tolerance  Patient tolerated treatment well;Patient limited by pain    Behavior During Therapy  [facility] for tasks assessed/performed       Past Medical History:  Diagnosis Date  . Asthma   . Environmental allergies   . History of echocardiogram    a. Echo 6/17: EF 60-65%, no RWMA, diastolic function normal, mod LAE  . Hypertension   . OSA (obstructive sleep apnea)    on CPAP    Past Surgical History:  Procedure Laterality Date  . ARTHROSCOPIC REPAIR ACL Right 2005   x2    There were no vitals filed for this visit.    Pt. entered PT with a kleenex in his nose and reports he has been having drainage over past couple days.        Treatment:  Therex:  Scifit L7.5 10 min. 2nd step (12"): step ups with UE assist on handrails 10x (difficulty but good knee position). Calf raises off stairs 2x15 Squats 20x with 5-10 sec holds at //-bars (light UE assist required and fatigue noted) Sled push/pull 100# 3x in hallway Supine bicycles: 10x2.  SAQ: 10x2 SLS (light UE assist/ decrease ankle control) Monster walks: fwd/ bkwd/ lateral 5 laps each BTB Walking in PT clinic with focus on consistent gait pattern/ heel strike/ knee flexion  Discussed benefits of gym based ex.       PT Long Term Goals - 11/19/18 9937      PT LONG TERM GOAL #1   Title  Pt. will increase FOTO to 50 to improve pain-free mobility.      Baseline  initial FOTO: 37.   09/29/18: 39 (no significant changes) 2/28: 49    Time  4    Period  Weeks    Status  Partially Met    Target Date  12/31/18      PT LONG TERM GOAL #2   Title  Pt. will increase R LE muscle strength to grossly 5/5 MMT (quad/ hip flexion) to improve pain-free mobility.      Baseline  Pt. acheived R LE 5/5 resisted knee extension (with pain)    Time  6    Period  Weeks    Status  Achieved    Target Date  10/05/18      PT LONG TERM GOAL #3   Title  Pt. able to stand from chair/ commode with no UE assist and proper mechanics to improve pain-free mobility.    Baseline  pt. able to stand from rolling stool with no UE assist.     Time  4    Period  Weeks    Status  Achieved    Target Date  09/29/18      PT LONG TERM GOAL #4   Title  Pt. able to ambulate on outside surfaces with more normalized gait pattern no  assistive device safely.      Baseline  Pt. reports pain on uneven terrain and ambulates with mod. antalgic and wide BOS gait without AD    Time  4    Period  Weeks    Status  Partially Met    Target Date  12/17/18      PT LONG TERM GOAL #5   Title  Pt. able to complete 30 minutes of ther.ex./ gym based exercise with no increase c/o pain to improve overall muscle endurance/ return to work.     Baseline  Pt. still fatigues easily with therex and has not been able to complete full 30 min gym workout without fatigue/ pain. Pt. still not working.    Time  4    Period  Weeks    Status  Not Met    Target Date  12/31/18            Plan - 11/28/18 3220    Clinical Impression Statement  Pt. worked hard during PT tx. session with focus on R quad stability.  No increase c/o knee pain reported but B ankle instability noted during balance tasks in //-bars.  Pt. will benefit from joining a jym but limited by finances.  Pt. will work on walking increase distances to improve endurance/ pain-free mobility.      Stability/Clinical Decision Making  Evolving/Moderate complexity    Clinical  Decision Making  Moderate    Rehab Potential  Good    PT Frequency  1x / week    PT Duration  6 weeks    PT Treatment/Interventions  ADLs/Self Care Home Management;Aquatic Therapy;Cryotherapy;Electrical Stimulation;Moist Heat;Gait training;Stair training;Functional mobility training;Therapeutic activities;Therapeutic exercise;Balance training;Patient/family education;Neuromuscular re-education;Manual techniques;Dry needling;Passive range of motion    PT Next Visit Plan  Progression of B LE strengthening, ankle control and improving endurance throughout session.  Check [employer] authorization dates.     PT Home Exercise Plan  see handouts       Patient will benefit from skilled therapeutic intervention in order to improve the following deficits and impairments:  Abnormal gait, Improper body mechanics, Pain, Decreased mobility, Decreased activity tolerance, Decreased endurance, Decreased range of motion, Decreased strength, Hypomobility, Obesity, Impaired flexibility, Difficulty walking, Decreased balance  Visit Diagnosis: Chronic pain of right knee  Muscle weakness (generalized)  Gait difficulty  History of falling     Problem List Patient Active Problem List   Diagnosis Date Noted  . Chest pain 03/05/2016  . SOB (shortness of breath) 03/05/2016  . Abnormal EKG 03/05/2016   Pura Spice, PT, DPT # [id number] 11/28/2018, 9:03 AM  [place] [facility] [address] [place], [place], [address] Phone: [phone]   Fax:  [phone]  Name: Christopher Luna MRN: [id number] Date of Birth: 1985/11/08

## 2018-11-24 NOTE — Discharge Instructions (Addendum)
Please seek medical attention for any high fevers, chest pain, shortness of breath, change in behavior, persistent vomiting, bloody stool or any other new or concerning symptoms.  

## 2018-11-24 NOTE — ED Provider Notes (Signed)
[facility] Emergency Department Provider Note   ____________________________________________   I have reviewed the triage vital signs and the nursing notes.   HISTORY  Chief Complaint Shortness of Breath; Hypertension; and Chest Pain   History limited by: Not Limited   HPI Christopher Luna is a 33 y.o. male who presents to the emergency department today because of concerns for high blood pressure noted at urgent care.  Patient states however he went to urgent care because of concerns for some weakness, sinus issues, shortness of breath, pain.  He states his symptoms have been going on for the past few days.  They have been fairly constant.  He has been trying over-the-counter medications without any significant relief.  He does have history of hypertension although states he has not been taking his medications for the past few days.  When he presented to urgent care they were concerned for elevated blood pressure so sent to the emergency department.   Per medical record review patient has a history of HTN, asthma.   Past Medical History:  Diagnosis Date  . Asthma   . Environmental allergies   . History of echocardiogram    a. Echo 6/17: EF 60-65%, no RWMA, diastolic function normal, mod LAE  . Hypertension   . OSA (obstructive sleep apnea)    on CPAP    Patient Active Problem List   Diagnosis Date Noted  . Chest pain 03/05/2016  . SOB (shortness of breath) 03/05/2016  . Abnormal EKG 03/05/2016    Past Surgical History:  Procedure Laterality Date  . ARTHROSCOPIC REPAIR ACL Right 2005   x2    Prior to Admission medications   Medication Sig Start Date End Date Taking? Authorizing Provider  albuterol (PROVENTIL HFA;VENTOLIN HFA) 108 (90 Base) MCG/ACT inhaler Inhale 1-2 puffs into the lungs every 6 (six) hours as needed for wheezing or shortness of breath. Use with spacer 01/06/17   Lutricia Feil, PA-C  amLODipine (NORVASC) 5 MG tablet Take 1 tablet  (5 mg total) by mouth daily. 04/24/18   Evon Slack, PA-C  gabapentin (NEURONTIN) 300 MG capsule Take by mouth. 03/31/18 04/30/18  [provider]  hydrochlorothiazide (HYDRODIURIL) 12.5 MG tablet Take 12.5 mg by mouth daily.    [provider]  HYDROcodone-acetaminophen (NORCO) 5-325 MG tablet Take 1 tablet by mouth every 6 (six) hours as needed for moderate pain. 10/02/18   Evon Slack, PA-C  lisinopril (PRINIVIL,ZESTRIL) 5 MG tablet Take 5 mg by mouth daily.    [provider]  predniSONE (DELTASONE) 10 MG tablet Take 1 tablet (10 mg total) by mouth daily. 6,5,4,3,2,1 six day taper 10/02/18   Evon Slack, PA-C  topiramate (TOPAMAX) 25 MG capsule Take 25 mg by mouth 2 (two) times daily.    [provider]    Allergies Patient has no known allergies.  Family History  Problem Relation Age of Onset  . Hypertension Mother   . Hypertension Father   . Asthma Neg Hx     Social History Social History   Tobacco Use  . Smoking status: Never Smoker  . Smokeless tobacco: Never Used  Substance Use Topics  . Alcohol use: Yes    Comment: social  . Drug use: No    Review of Systems Constitutional: No fever Eyes: No visual changes. ENT: No sore throat. Cardiovascular: Positive for chest pain. Respiratory: Positive for shortness of breath. Gastrointestinal: No abdominal pain.   Genitourinary: Negative for dysuria. Musculoskeletal: Positive for  back pain. Skin: Negative for rash. Neurological: Positive for headache  ____________________________________________   PHYSICAL EXAM:  VITAL SIGNS: ED Triage Vitals  Enc Vitals Group     BP 11/24/18 1947 (!) 190/107     Pulse Rate 11/24/18 1947 100     Resp 11/24/18 1947 (!) 21     Temp 11/24/18 1947 98.6 F (37 C)     Temp Source 11/24/18 1947 Oral     SpO2 11/24/18 1947 97 %     Weight 11/24/18 1950 (!) 407 lb (184.6 kg)     Height 11/24/18 1950 6\' 5"  (1.956 m)   Constitutional: Alert  and oriented.  Eyes: Conjunctivae are normal.  ENT      Head: Normocephalic and atraumatic.      Nose: No congestion/rhinnorhea.      Mouth/Throat: Mucous membranes are moist.      Neck: No stridor. Hematological/Lymphatic/Immunilogical: No cervical lymphadenopathy. Cardiovascular: Normal rate, regular rhythm.  No murmurs, rubs, or gallops.  Respiratory: Normal respiratory effort without tachypnea nor retractions. Breath sounds are clear and equal bilaterally. No wheezes/rales/rhonchi. Gastrointestinal: Soft and non tender. No rebound. No guarding.  Genitourinary: Deferred Musculoskeletal: Normal range of motion in all extremities. No lower extremity edema. Neurologic:  Normal speech and language. No gross focal neurologic deficits are appreciated.  Skin:  Skin is warm, dry and intact. No rash noted. Psychiatric: Mood and affect are normal. Speech and behavior are normal. Patient exhibits appropriate insight and judgment.  ____________________________________________    LABS (pertinent positives/negatives)  CMP wnl except ca 8.8 Influenza negative Lipase 23 Trop <0.03 CBC wbc 11.9, hgb 14.5, plt 290  ____________________________________________   EKG  I, Phineas Semen, attending physician, personally viewed and interpreted this EKG  EKG Time: 1952 Rate: 97 Rhythm: sinus rhythm Axis: normal Intervals: qtc 451 QRS: narrow ST changes: no st elevation, st elevation in v2, v3 Impression: abnormal ekg, no change from ekg dated 10/09/2017  ____________________________________________    RADIOLOGY  CXR Left lower lobe opacity  ____________________________________________   PROCEDURES  Procedures  ____________________________________________   INITIAL IMPRESSION / ASSESSMENT AND PLAN / ED COURSE  Pertinent labs & imaging results that were available during my care of the patient were reviewed by me and considered in my medical decision making (see chart for  details).   Patient presents to the emergency department today because of symptoms consistent with an upper respiratory illness.  Chest x-ray was performed and was concerning for pneumonia.  Patient had mild leukocytosis.  Patient was given IV fluids as well as antibiotics.  Did feel improvement.  This point I do think is reasonable to treat patient as an outpatient.  Will give patient prescription for antibiotics.  ____________________________________________   FINAL CLINICAL IMPRESSION(S) / ED DIAGNOSES  Final diagnoses:  Community acquired pneumonia, unspecified laterality     Note: This dictation was prepared with Office manager. Any transcriptional errors that result from this process are unintentional     Phineas Semen, MD 11/24/18 2318

## 2018-11-24 NOTE — ED Provider Notes (Signed)
[facility]    CSN: [id number] Arrival date & time: 11/24/18  1806  History   Chief Complaint Chief Complaint  Patient presents with  . Shortness of Breath  . Nasal Congestion   HPI  33 year old male presents with multiple complaints.  Patient reports that he has not been feeling well since February 24.  Reports congestion and rhinorrhea and associated cough and shortness of breath.  Improved and then worsened over the past week.  Patient states that he has been in bed all week.  Patient states that he is experiencing chest pain, shortness of breath.  He also reports back pain.  Patient states that he hurts all over.  Patient states that he has been having increasing shortness of breath.  Patient went to physical therapy today.  Patient feels poorly.  Blood pressure markedly elevated.  Patient noncompliant.  No other associated symptoms.  No other complaints.  PMH, Surgical Hx, Family Hx, Social History reviewed and updated as below.  Past Medical History:  Diagnosis Date  . Asthma   . Environmental allergies   . History of echocardiogram    a. Echo 6/17: EF 60-65%, no RWMA, diastolic function normal, mod LAE  . Hypertension   . OSA (obstructive sleep apnea)    on CPAP   Patient Active Problem List   Diagnosis Date Noted  . Chest pain 03/05/2016  . SOB (shortness of breath) 03/05/2016  . Abnormal EKG 03/05/2016   Past Surgical History:  Procedure Laterality Date  . ARTHROSCOPIC REPAIR ACL Right 2005   x2   Home Medications    Prior to Admission medications   Medication Sig Start Date End Date Taking? Authorizing Provider  amLODipine (NORVASC) 5 MG tablet Take 1 tablet (5 mg total) by mouth daily. 04/24/18  Yes Evon Slack, PA-C  hydrochlorothiazide (HYDRODIURIL) 12.5 MG tablet Take 12.5 mg by mouth daily.   Yes [provider]  lisinopril (PRINIVIL,ZESTRIL) 5 MG tablet Take 5 mg by mouth daily.   Yes [provider]  topiramate  (TOPAMAX) 25 MG capsule Take 25 mg by mouth 2 (two) times daily.   Yes [provider]  albuterol (PROVENTIL HFA;VENTOLIN HFA) 108 (90 Base) MCG/ACT inhaler Inhale 1-2 puffs into the lungs every 6 (six) hours as needed for wheezing or shortness of breath. Use with spacer 01/06/17   Lutricia Feil, PA-C  gabapentin (NEURONTIN) 300 MG capsule Take by mouth. 03/31/18 04/30/18  [provider]  HYDROcodone-acetaminophen (NORCO) 5-325 MG tablet Take 1 tablet by mouth every 6 (six) hours as needed for moderate pain. 10/02/18   Evon Slack, PA-C  predniSONE (DELTASONE) 10 MG tablet Take 1 tablet (10 mg total) by mouth daily. 6,5,4,3,2,1 six day taper 10/02/18   Evon Slack, PA-C    Family History Family History  Problem Relation Age of Onset  . Hypertension Mother   . Hypertension Father   . Asthma Neg Hx     Social History Social History   Tobacco Use  . Smoking status: Never Smoker  . Smokeless tobacco: Never Used  Substance Use Topics  . Alcohol use: Yes    Comment: social  . Drug use: No   Allergies   Patient has no known allergies.  Review of Systems Review of Systems  Constitutional: Positive for fatigue.  Respiratory: Positive for shortness of breath.   Cardiovascular: Positive for chest pain.  Musculoskeletal:       Diffuse pain.   Physical Exam Triage Vital Signs  ED Triage Vitals  Enc Vitals Group     BP 11/24/18 1820 (!) 196/104     Pulse Rate 11/24/18 1820 (!) 105     Resp 11/24/18 1820 (!) 24     Temp 11/24/18 1820 98.3 F (36.8 C)     Temp Source 11/24/18 1820 Oral     SpO2 11/24/18 1820 98 %     Weight --      Height --      Head Circumference --      Peak Flow --      Pain Score 11/24/18 1822 7     Pain Loc --      Pain Edu? --      Excl. in GC? --    Updated Vital Signs BP (!) 196/104 (BP Location: Right Arm)   Pulse (!) 105   Temp 98.3 F (36.8 C) (Oral)   Resp (!) 24   SpO2 98%   Visual Acuity Right Eye Distance:     Left Eye Distance:   Bilateral Distance:    Right Eye Near:   Left Eye Near:    Bilateral Near:     Physical Exam Vitals signs and nursing note reviewed.  Constitutional:      General: He is not in acute distress.    Appearance: Normal appearance. He is well-developed.     Comments: Morbidly obese.  HENT:     Head: Normocephalic and atraumatic.     Mouth/Throat:     Pharynx: Posterior oropharyngeal erythema present. No oropharyngeal exudate.  Eyes:     General:        Right eye: No discharge.        Left eye: No discharge.     Conjunctiva/sclera: Conjunctivae normal.  Cardiovascular:     Rate and Rhythm: Regular rhythm. Tachycardia present.  Pulmonary:     Effort: Pulmonary effort is normal.     Breath sounds: Normal breath sounds.  Neurological:     Mental Status: He is alert.  Psychiatric:        Mood and Affect: Mood normal.        Behavior: Behavior normal.    UC Treatments / Results  Labs (all labs ordered are listed, but only abnormal results are displayed) Labs Reviewed - No data to display  EKG Interpretation:  Normal sinus rhythm with rate of 100.  Normal axis.  Prolonged QT.  No discrete ST or T wave changes.  Radiology No results found.  Procedures Procedures (including critical care time)  Medications Ordered in UC Medications - No data to display  Initial Impression / Assessment and Plan / UC Course  I have reviewed the triage vital signs and the nursing notes.  Pertinent labs & imaging results that were available during my care of the patient were reviewed by me and considered in my medical decision making (see chart for details).    33 year old male presents with chest pain, shortness of breath and has a BP of 196/104.  Given ongoing shortness of breath, chest pain, and markedly elevated blood pressure, I am sending him directly to the ER for further evaluation and management.   Final Clinical Impressions(s) / UC Diagnoses   Final  diagnoses:  Hypertensive emergency   Discharge Instructions   None    ED Prescriptions    None     Controlled Substance Prescriptions [place] Controlled Substance Registry consulted? Not Applicable   Tommie Sams, DO 11/24/18 2018

## 2018-11-24 NOTE — ED Triage Notes (Signed)
Patient reports nasal/drainage since the 24th, states now has cough, SOB, sore throat, and fatigue with mid back pain. Denies any fever. Patient able to talk in complaint sentences, was short of breath after walking from waiting room to exam room.   O2 sats 96% after walking and HR 119.

## 2018-11-24 NOTE — ED Triage Notes (Signed)
Pt here via [facility] from [facility]  , with report of being sick for the last 3-4 days , sob htn and chest pain . A/Ox3 NADN

## 2018-11-24 NOTE — ED Notes (Signed)
BLOOD TUBES SENT TO LAB ON HOLD

## 2018-12-03 ENCOUNTER — Ambulatory Visit: Payer: Medicaid Other | Admitting: Physical Therapy

## 2018-12-10 ENCOUNTER — Ambulatory Visit: Payer: Medicaid Other | Admitting: Physical Therapy

## 2018-12-17 ENCOUNTER — Encounter: Payer: Medicaid Other | Admitting: Physical Therapy

## 2018-12-24 ENCOUNTER — Encounter: Payer: Medicaid Other | Admitting: Physical Therapy

## 2018-12-29 DIAGNOSIS — G4733 Obstructive sleep apnea (adult) (pediatric): Secondary | ICD-10-CM | POA: Insufficient documentation

## 2018-12-31 ENCOUNTER — Encounter: Payer: Medicaid Other | Admitting: Physical Therapy

## 2019-01-07 ENCOUNTER — Encounter: Payer: Medicaid Other | Admitting: Physical Therapy

## 2019-01-20 ENCOUNTER — Encounter: Payer: Self-pay | Admitting: Emergency Medicine

## 2019-01-20 ENCOUNTER — Emergency Department
Admission: EM | Admit: 2019-01-20 | Discharge: 2019-01-20 | Disposition: A | Discharge status: Home / Self Care | Payer: Medicaid Other | Attending: Emergency Medicine | Admitting: Emergency Medicine

## 2019-01-20 ENCOUNTER — Other Ambulatory Visit: Payer: Self-pay

## 2019-01-20 ENCOUNTER — Emergency Department: Payer: Medicaid Other

## 2019-01-20 DIAGNOSIS — R55 Syncope and collapse: Secondary | ICD-10-CM | POA: Diagnosis present

## 2019-01-20 DIAGNOSIS — G47411 Narcolepsy with cataplexy: Secondary | ICD-10-CM | POA: Diagnosis not present

## 2019-01-20 DIAGNOSIS — I10 Essential (primary) hypertension: Secondary | ICD-10-CM | POA: Diagnosis not present

## 2019-01-20 DIAGNOSIS — Z79899 Other long term (current) drug therapy: Secondary | ICD-10-CM | POA: Insufficient documentation

## 2019-01-20 LAB — CBC WITH DIFFERENTIAL/PLATELET
Abs Immature Granulocytes: 0.03 10*3/uL (ref 0.00–0.07)
Basophils Absolute: 0.1 10*3/uL (ref 0.0–0.1)
Basophils Relative: 1 %
Eosinophils Absolute: 0.3 10*3/uL (ref 0.0–0.5)
Eosinophils Relative: 3 %
HCT: 43.3 % (ref 39.0–52.0)
Hemoglobin: 14.4 g/dL (ref 13.0–17.0)
Immature Granulocytes: 0 %
Lymphocytes Relative: 29 %
Lymphs Abs: 2.7 10*3/uL (ref 0.7–4.0)
MCH: 27.8 pg (ref 26.0–34.0)
MCHC: 33.3 g/dL (ref 30.0–36.0)
MCV: 83.6 fL (ref 80.0–100.0)
Monocytes Absolute: 0.8 10*3/uL (ref 0.1–1.0)
Monocytes Relative: 9 %
Neutro Abs: 5.5 10*3/uL (ref 1.7–7.7)
Neutrophils Relative %: 58 %
Platelets: 292 10*3/uL (ref 150–400)
RBC: 5.18 MIL/uL (ref 4.22–5.81)
RDW: 13.5 % (ref 11.5–15.5)
WBC: 9.4 10*3/uL (ref 4.0–10.5)
nRBC: 0 % (ref 0.0–0.2)

## 2019-01-20 LAB — BASIC METABOLIC PANEL WITH GFR
Anion gap: 9 (ref 5–15)
BUN: 11 mg/dL (ref 6–20)
CO2: 27 mmol/L (ref 22–32)
Calcium: 8.4 mg/dL — ABNORMAL LOW (ref 8.9–10.3)
Chloride: 103 mmol/L (ref 98–111)
Creatinine, Ser: 1.09 mg/dL (ref 0.61–1.24)
GFR calc Af Amer: >60 mL/min
GFR calc non Af Amer: >60 mL/min
Glucose, Bld: 160 mg/dL — ABNORMAL HIGH (ref 70–99)
Potassium: 3.2 mmol/L — ABNORMAL LOW (ref 3.5–5.1)
Sodium: 139 mmol/L (ref 135–145)

## 2019-01-20 LAB — GLUCOSE, CAPILLARY: Glucose-Capillary: 158 mg/dL — ABNORMAL HIGH (ref 70–99)

## 2019-01-20 NOTE — ED Provider Notes (Signed)
[facility] Emergency Department Provider Note   First MD Initiated Contact with Patient 01/20/19 0128     (approximate)  I have reviewed the triage vital signs and the nursing notes.   HISTORY  Chief Complaint Loss of Consciousness   HPI Christopher Luna is a 33 y.o. male with below list of previous medical conditions including sleep apnea for which the patient does not currently use a CPAP or any other therapy presents to the emergency department with history of repetitively falling asleep.  Patient states that tonight he abruptly fell asleep while standing resulting in him falling and striking his face.  Patient states that the episodes are often preceded by loss of motor function and involuntary movements of his arms.  Patient denies any preceding chest pain dyspnea palpitations nausea or vomiting or any other complaints.  Patient states that he has been witnessed falling asleep abruptly and that the bystanders state that he does know seizure-like activity while asleep.  Patient is currently being evaluated by Dr. Sherryll Burger neurologist.        Past Medical History:  Diagnosis Date  . Asthma   . Environmental allergies   . History of echocardiogram    a. Echo 6/17: EF 60-65%, no RWMA, diastolic function normal, mod LAE  . Hypertension   . OSA (obstructive sleep apnea)    on CPAP    Patient Active Problem List   Diagnosis Date Noted  . Chest pain 03/05/2016  . SOB (shortness of breath) 03/05/2016  . Abnormal EKG 03/05/2016    Past Surgical History:  Procedure Laterality Date  . ARTHROSCOPIC REPAIR ACL Right 2005   x2    Prior to Admission medications   Medication Sig Start Date End Date Taking? Authorizing Provider  albuterol (PROVENTIL HFA;VENTOLIN HFA) 108 (90 Base) MCG/ACT inhaler Inhale 1-2 puffs into the lungs every 6 (six) hours as needed for wheezing or shortness of breath. Use with spacer 01/06/17   Lutricia Feil, PA-C  amLODipine  (NORVASC) 10 MG tablet Take 10 mg by mouth daily. 08/02/18   [provider]  gabapentin (NEURONTIN) 300 MG capsule Take by mouth. 03/31/18 11/24/18  [provider]  hydrochlorothiazide (HYDRODIURIL) 25 MG tablet Take 25 mg by mouth daily. 05/25/18   [provider]  HYDROcodone-acetaminophen (NORCO) 5-325 MG tablet Take 1 tablet by mouth every 6 (six) hours as needed for moderate pain. Patient not taking: Reported on 11/24/2018 10/02/18   Evon Slack, PA-C  lisinopril (PRINIVIL,ZESTRIL) 10 MG tablet Take 10 mg by mouth daily. 05/25/18 09/29/19  [provider]  predniSONE (DELTASONE) 10 MG tablet Take 1 tablet (10 mg total) by mouth daily. 6,5,4,3,2,1 six day taper Patient not taking: Reported on 11/24/2018 10/02/18   Evon Slack, PA-C  topiramate (TOPAMAX) 100 MG tablet Take 50-100 mg by mouth Nightly. 10/18/18   [provider]    Allergies Patient has no known allergies.  Family History  Problem Relation Age of Onset  . Hypertension Mother   . Hypertension Father   . Asthma Neg Hx     Social History Social History   Tobacco Use  . Smoking status: Never Smoker  . Smokeless tobacco: Never Used  Substance Use Topics  . Alcohol use: Yes    Comment: social  . Drug use: No    Review of Systems Constitutional: No fever/chills Eyes: No visual changes. ENT: No sore throat. Cardiovascular: Denies chest pain. Respiratory: Denies shortness of breath. Gastrointestinal: No abdominal pain.  No nausea, no vomiting.  No diarrhea.  No constipation. Genitourinary: Negative for dysuria. Musculoskeletal: Negative for neck pain.  Negative for back pain. Integumentary: Negative for rash. Neurological: Negative for headaches, focal weakness or numbness.  Positive for abruptly falling asleep   ____________________________________________   PHYSICAL EXAM:  VITAL SIGNS: ED Triage Vitals  Enc Vitals Group     BP 01/20/19 0110 (!) 172/108      Pulse Rate 01/20/19 0110 87     Resp 01/20/19 0110 20     Temp 01/20/19 0110 98 F (36.7 C)     Temp Source 01/20/19 0110 Oral     SpO2 01/20/19 0110 94 %     Weight 01/20/19 0108 (!) 185.1 kg (408 lb)     Height 01/20/19 0108 1.956 m (6\' 5" )     Head Circumference --      Peak Flow --      Pain Score 01/20/19 0107 5     Pain Loc --      Pain Edu? --      Excl. in GC? --     Constitutional: Alert and oriented. Well appearing and in no acute distress. Eyes: Conjunctivae are normal. PERRL. EOMI. Head: 1 cm linear superficial abrasion on the nasal bridge Mouth/Throat: Mucous membranes are moist.  Oropharynx non-erythematous. Neck: No stridor.  No meningeal signs.  No cervical spine tenderness to palpation. Cardiovascular: Normal rate, regular rhythm. Good peripheral circulation. Grossly normal heart sounds. Respiratory: Normal respiratory effort.  No retractions. No audible wheezing. Gastrointestinal: Soft and nontender. No distention.  Musculoskeletal: No lower extremity tenderness nor edema. No gross deformities of extremities. Neurologic:  Normal speech and language. No gross focal neurologic deficits are appreciated.  Skin:  Skin is warm, dry and intact. No rash noted. Psychiatric: Mood and affect are normal. Speech and behavior are normal.  ____________________________________________   LABS (all labs ordered are listed, but only abnormal results are displayed)  Labs Reviewed  BASIC METABOLIC PANEL - Abnormal; Notable for the following components:      Result Value   Potassium 3.2 (*)    Glucose, Bld 160 (*)    Calcium 8.4 (*)    All other components within normal limits  GLUCOSE, CAPILLARY - Abnormal; Notable for the following components:   Glucose-Capillary 158 (*)    All other components within normal limits  CBC WITH DIFFERENTIAL/PLATELET  CBG MONITORING, ED   ____________________________________________  EKG  ED ECG REPORT I, Roann N Madia Carvell, the attending  physician, personally viewed and interpreted this ECG.   Date: 01/20/2019  EKG Time: 1:18 AM  Rate: 93  Rhythm: Normal sinus rhythm  Axis: Normal  Intervals: Normal  ST&T Change: None  ____________________________________________  RADIOLOGY I, Olean N Lovelee Forner, personally viewed and evaluated these images (plain radiographs) as part of my medical decision making, as well as reviewing the written report by the radiologist.  ED MD interpretation: Negative head CT per radiologist.  Official radiology report(s): Ct Head Wo Contrast  Result Date: 01/20/2019 CLINICAL DATA:  Initial evaluation for acute syncope. EXAM: CT HEAD WITHOUT CONTRAST TECHNIQUE: Contiguous axial images were obtained from the base of the skull through the vertex without intravenous contrast. COMPARISON:  Prior MRI from 09/21/2018. FINDINGS: Brain: Cerebral volume within normal limits for patient age. No evidence for acute intracranial hemorrhage. No findings to suggest acute large vessel territory infarct. No mass lesion, midline shift, or mass effect. Ventricles are normal in size without evidence for hydrocephalus. No extra-axial fluid collection  identified. Vascular: No hyperdense vessel identified. Skull: Scalp soft tissues demonstrate no acute abnormality. Calvarium intact. Sinuses/Orbits: Globes and orbital soft tissues within normal limits. Visualized paranasal sinuses are clear. Moderate bilateral mastoid effusions. IMPRESSION: 1. Negative head CT. No acute intracranial process identified. 2. Moderate bilateral mastoid effusions. Electronically Signed   By: Rise MuBenjamin  McClintock M.D.   On: 01/20/2019 02:19     Procedures   ____________________________________________   INITIAL IMPRESSION / MDM / ASSESSMENT AND PLAN / ED COURSE  As part of my medical decision making, I reviewed the following data within the electronic MEDICAL RECORD NUMBER   33 year old male presents emergency department above-stated history and  physical exam with history of abruptly falling asleep with resultant facial injury.  Concern that the patient's daytime somnolence and abruptly falling asleep may be secondary to narcolepsy with cataplexy given the reported history.  Also considered a possibility that the patient's daytime somnolence is also secondary to the patient's known history of sleep apnea for which he does not currently use a CPAP.  Laboratory data in the emergency department unremarkable CT head likewise.  I spoke with the patient at length regarding necessity of following up with Dr. Sherryll BurgerShah neurology for further outpatient evaluation.  ____________________________________________  FINAL CLINICAL IMPRESSION(S) / ED DIAGNOSES  Final diagnoses:  Syncope, unspecified syncope type  Primary narcolepsy with cataplexy     MEDICATIONS GIVEN DURING THIS VISIT:  Medications - No data to display   ED Discharge Orders    None       Note:  This document was prepared using Dragon voice recognition software and may include unintentional dictation errors.   Darci CurrentBrown, Triplett N, MD 01/20/19 [phone]
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
## 2019-02-16 ENCOUNTER — Other Ambulatory Visit: Payer: Self-pay

## 2019-02-16 ENCOUNTER — Ambulatory Visit: Payer: Medicaid Other | Attending: Physician Assistant | Admitting: Physical Therapy

## 2019-02-16 ENCOUNTER — Encounter: Payer: Self-pay | Admitting: Physical Therapy

## 2019-02-16 DIAGNOSIS — M6281 Muscle weakness (generalized): Secondary | ICD-10-CM | POA: Diagnosis present

## 2019-02-16 DIAGNOSIS — G8929 Other chronic pain: Secondary | ICD-10-CM | POA: Diagnosis present

## 2019-02-16 DIAGNOSIS — Z9181 History of falling: Secondary | ICD-10-CM | POA: Diagnosis present

## 2019-02-16 DIAGNOSIS — R269 Unspecified abnormalities of gait and mobility: Secondary | ICD-10-CM

## 2019-02-16 DIAGNOSIS — M25561 Pain in right knee: Secondary | ICD-10-CM | POA: Diagnosis not present
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
## 2019-02-24 ENCOUNTER — Ambulatory Visit: Payer: Medicaid Other | Attending: Physician Assistant | Admitting: Physical Therapy

## 2019-02-24 DIAGNOSIS — R269 Unspecified abnormalities of gait and mobility: Secondary | ICD-10-CM | POA: Insufficient documentation

## 2019-02-24 DIAGNOSIS — M6281 Muscle weakness (generalized): Secondary | ICD-10-CM | POA: Insufficient documentation

## 2019-02-24 DIAGNOSIS — M25561 Pain in right knee: Secondary | ICD-10-CM | POA: Insufficient documentation

## 2019-02-24 DIAGNOSIS — Z9181 History of falling: Secondary | ICD-10-CM | POA: Insufficient documentation

## 2019-02-24 DIAGNOSIS — G8929 Other chronic pain: Secondary | ICD-10-CM | POA: Insufficient documentation

## 2019-03-01 DIAGNOSIS — F4310 Post-traumatic stress disorder, unspecified: Secondary | ICD-10-CM | POA: Insufficient documentation

## 2019-03-03 ENCOUNTER — Other Ambulatory Visit: Payer: Self-pay | Admitting: Neurology

## 2019-03-03 ENCOUNTER — Ambulatory Visit: Payer: Medicaid Other | Admitting: Physical Therapy

## 2019-03-03 ENCOUNTER — Other Ambulatory Visit: Payer: Self-pay

## 2019-03-03 DIAGNOSIS — M48061 Spinal stenosis, lumbar region without neurogenic claudication: Secondary | ICD-10-CM

## 2019-03-10 ENCOUNTER — Encounter: Payer: Self-pay | Admitting: Physical Therapy

## 2019-03-10 ENCOUNTER — Other Ambulatory Visit: Payer: Self-pay

## 2019-03-10 ENCOUNTER — Ambulatory Visit: Payer: Medicaid Other | Admitting: Physical Therapy

## 2019-03-10 DIAGNOSIS — R269 Unspecified abnormalities of gait and mobility: Secondary | ICD-10-CM

## 2019-03-10 DIAGNOSIS — Z9181 History of falling: Secondary | ICD-10-CM

## 2019-03-10 DIAGNOSIS — G8929 Other chronic pain: Secondary | ICD-10-CM

## 2019-03-10 DIAGNOSIS — M25561 Pain in right knee: Secondary | ICD-10-CM | POA: Diagnosis present

## 2019-03-10 DIAGNOSIS — M6281 Muscle weakness (generalized): Secondary | ICD-10-CM
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
## 2019-03-16 ENCOUNTER — Other Ambulatory Visit: Payer: Self-pay

## 2019-03-16 ENCOUNTER — Ambulatory Visit
Admission: RE | Admit: 2019-03-16 | Discharge: 2019-03-16 | Disposition: A | Discharge status: Home / Self Care | Payer: Medicaid Other | Source: Ambulatory Visit | Attending: Neurology | Admitting: Neurology

## 2019-03-16 DIAGNOSIS — M48061 Spinal stenosis, lumbar region without neurogenic claudication: Secondary | ICD-10-CM | POA: Insufficient documentation

## 2019-03-17 ENCOUNTER — Ambulatory Visit: Payer: Medicaid Other | Admitting: Physical Therapy

## 2019-04-19 ENCOUNTER — Other Ambulatory Visit: Payer: Self-pay | Admitting: Orthopedic Surgery

## 2019-04-19 DIAGNOSIS — M25561 Pain in right knee: Secondary | ICD-10-CM
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
## 2019-05-14 ENCOUNTER — Other Ambulatory Visit: Payer: Self-pay

## 2019-05-14 ENCOUNTER — Ambulatory Visit
Admission: RE | Admit: 2019-05-14 | Discharge: 2019-05-14 | Disposition: A | Discharge status: Home / Self Care | Payer: Medicaid Other | Source: Ambulatory Visit | Attending: Orthopedic Surgery | Admitting: Orthopedic Surgery

## 2019-05-14 DIAGNOSIS — M25561 Pain in right knee: Secondary | ICD-10-CM

## 2019-08-10 ENCOUNTER — Encounter (INDEPENDENT_AMBULATORY_CARE_PROVIDER_SITE_OTHER): Payer: Self-pay | Admitting: Bariatrics

## 2019-08-11 ENCOUNTER — Ambulatory Visit (INDEPENDENT_AMBULATORY_CARE_PROVIDER_SITE_OTHER): Payer: Self-pay | Admitting: Bariatrics

## 2019-08-25 ENCOUNTER — Ambulatory Visit (INDEPENDENT_AMBULATORY_CARE_PROVIDER_SITE_OTHER): Payer: Self-pay | Admitting: Bariatrics

## 2023-05-17 ENCOUNTER — Emergency Department
Admission: EM | Admit: 2023-05-17 | Discharge: 2023-05-17 | Disposition: A | Discharge status: Home / Self Care | Payer: Medicaid Other | Source: Home / Self Care | Attending: Emergency Medicine | Admitting: Emergency Medicine

## 2023-05-17 ENCOUNTER — Encounter: Payer: Self-pay | Admitting: Emergency Medicine

## 2023-05-17 ENCOUNTER — Other Ambulatory Visit: Payer: Self-pay

## 2023-05-17 DIAGNOSIS — Y92410 Unspecified street and highway as the place of occurrence of the external cause: Secondary | ICD-10-CM | POA: Insufficient documentation

## 2023-05-17 DIAGNOSIS — M25531 Pain in right wrist: Secondary | ICD-10-CM | POA: Diagnosis not present

## 2023-05-17 DIAGNOSIS — S301XXA Contusion of abdominal wall, initial encounter: Secondary | ICD-10-CM | POA: Insufficient documentation

## 2023-05-17 NOTE — ED Notes (Signed)
Pt A&O x4, no obvious distress noted, respirations regular/unlabored. Pt opted to leave prior to discharge paperwork being available. Pt ambulated out of ED without assistance.
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
## 2023-05-18 ENCOUNTER — Encounter (HOSPITAL_COMMUNITY): Payer: Self-pay

## 2023-05-18 ENCOUNTER — Emergency Department (HOSPITAL_COMMUNITY)
Admission: EM | Admit: 2023-05-18 | Discharge: 2023-05-18 | Disposition: A | Discharge status: Home / Self Care | Payer: Medicaid Other | Attending: Emergency Medicine | Admitting: Emergency Medicine

## 2023-05-18 ENCOUNTER — Other Ambulatory Visit: Payer: Self-pay

## 2023-05-18 ENCOUNTER — Emergency Department (HOSPITAL_COMMUNITY): Payer: Medicaid Other

## 2023-05-18 DIAGNOSIS — I10 Essential (primary) hypertension: Secondary | ICD-10-CM | POA: Insufficient documentation

## 2023-05-18 DIAGNOSIS — S40012A Contusion of left shoulder, initial encounter: Secondary | ICD-10-CM | POA: Insufficient documentation

## 2023-05-18 DIAGNOSIS — S8252XA Displaced fracture of medial malleolus of left tibia, initial encounter for closed fracture: Secondary | ICD-10-CM | POA: Diagnosis not present

## 2023-05-18 DIAGNOSIS — Z7951 Long term (current) use of inhaled steroids: Secondary | ICD-10-CM | POA: Insufficient documentation

## 2023-05-18 DIAGNOSIS — J45909 Unspecified asthma, uncomplicated: Secondary | ICD-10-CM | POA: Diagnosis not present

## 2023-05-18 DIAGNOSIS — Z79899 Other long term (current) drug therapy: Secondary | ICD-10-CM | POA: Diagnosis not present

## 2023-05-18 DIAGNOSIS — S301XXA Contusion of abdominal wall, initial encounter: Secondary | ICD-10-CM | POA: Insufficient documentation

## 2023-05-18 DIAGNOSIS — Y92410 Unspecified street and highway as the place of occurrence of the external cause: Secondary | ICD-10-CM | POA: Insufficient documentation

## 2023-05-18 DIAGNOSIS — S99912A Unspecified injury of left ankle, initial encounter: Secondary | ICD-10-CM | POA: Diagnosis present

## 2023-05-18 DIAGNOSIS — M25522 Pain in left elbow: Secondary | ICD-10-CM | POA: Diagnosis not present

## 2023-05-18 DIAGNOSIS — S93402A Sprain of unspecified ligament of left ankle, initial encounter: Secondary | ICD-10-CM

## 2023-05-18 LAB — BASIC METABOLIC PANEL
Anion gap: 14 (ref 5–15)
BUN: 15 mg/dL (ref 6–20)
CO2: 24 mmol/L (ref 22–32)
Calcium: 8.9 mg/dL (ref 8.9–10.3)
Chloride: 100 mmol/L (ref 98–111)
Creatinine, Ser: 1.09 mg/dL (ref 0.61–1.24)
GFR, Estimated: >60 mL/min (ref >60)
Glucose, Bld: 111 mg/dL — ABNORMAL HIGH (ref 70–99)
Potassium: 3.5 mmol/L (ref 3.5–5.1)
Sodium: 138 mmol/L (ref 135–145)

## 2023-05-18 LAB — CBC
HCT: 41.7 % (ref 39.0–52.0)
Hemoglobin: 14.3 g/dL (ref 13.0–17.0)
MCH: 28.6 pg (ref 26.0–34.0)
MCHC: 34.3 g/dL (ref 30.0–36.0)
MCV: 83.4 fL (ref 80.0–100.0)
Platelets: 209 10*3/uL (ref 150–400)
RBC: 5 MIL/uL (ref 4.22–5.81)
RDW: 12.6 % (ref 11.5–15.5)
WBC: 6.2 10*3/uL (ref 4.0–10.5)
nRBC: 0 % (ref 0.0–0.2)

## 2023-05-18 MED ORDER — IOHEXOL 300 MG/ML  SOLN
100.0000 mL | Freq: Once | INTRAMUSCULAR | Status: AC | PRN
  Administered 2023-05-18: 100 mL via INTRAVENOUS

## 2023-05-18 MED ORDER — METHOCARBAMOL 500 MG PO TABS: 500.0000 mg | ORAL_TABLET | Freq: Two times a day (BID) | ORAL | 0 refills | Status: AC

## 2023-05-18 MED ORDER — METHYLPREDNISOLONE SODIUM SUCC 40 MG IJ SOLR
40.0000 mg | Freq: Once | INTRAMUSCULAR | Status: AC
  Administered 2023-05-18: 40 mg via INTRAVENOUS
  Filled 2023-05-18: qty 1

## 2023-05-18 MED ORDER — DIPHENHYDRAMINE HCL 50 MG/ML IJ SOLN
50.0000 mg | Freq: Once | INTRAMUSCULAR | Status: AC
  Administered 2023-05-18: 50 mg via INTRAVENOUS
  Filled 2023-05-18: qty 1

## 2023-05-18 MED ORDER — ACETAMINOPHEN 325 MG PO TABS
650.0000 mg | ORAL_TABLET | Freq: Once | ORAL | Status: AC
  Administered 2023-05-18: 650 mg via ORAL
  Filled 2023-05-18: qty 2

## 2023-05-18 MED ORDER — MORPHINE SULFATE (PF) 4 MG/ML IV SOLN
4.0000 mg | Freq: Once | INTRAVENOUS | Status: AC
  Administered 2023-05-18: 4 mg via INTRAVENOUS
  Filled 2023-05-18: qty 1

## 2023-05-18 MED ORDER — DIPHENHYDRAMINE HCL 25 MG PO CAPS: 50.0000 mg | ORAL_CAPSULE | Freq: Once | ORAL | Status: AC

## 2023-05-18 NOTE — Discharge Instructions (Addendum)
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
## 2023-05-23 ENCOUNTER — Emergency Department: Payer: Medicaid Other

## 2023-05-23 ENCOUNTER — Other Ambulatory Visit: Payer: Self-pay

## 2023-05-23 ENCOUNTER — Emergency Department
Admission: EM | Admit: 2023-05-23 | Discharge: 2023-05-23 | Disposition: A | Discharge status: Home / Self Care | Payer: Medicaid Other | Attending: Emergency Medicine | Admitting: Emergency Medicine

## 2023-05-23 DIAGNOSIS — I10 Essential (primary) hypertension: Secondary | ICD-10-CM | POA: Insufficient documentation

## 2023-05-23 DIAGNOSIS — S3991XA Unspecified injury of abdomen, initial encounter: Secondary | ICD-10-CM | POA: Diagnosis present

## 2023-05-23 DIAGNOSIS — S301XXA Contusion of abdominal wall, initial encounter: Secondary | ICD-10-CM | POA: Diagnosis not present

## 2023-05-23 DIAGNOSIS — R519 Headache, unspecified: Secondary | ICD-10-CM | POA: Insufficient documentation

## 2023-05-23 DIAGNOSIS — J45909 Unspecified asthma, uncomplicated: Secondary | ICD-10-CM | POA: Insufficient documentation

## 2023-05-23 DIAGNOSIS — Y92410 Unspecified street and highway as the place of occurrence of the external cause: Secondary | ICD-10-CM | POA: Insufficient documentation

## 2023-05-23 DIAGNOSIS — S1093XA Contusion of unspecified part of neck, initial encounter: Secondary | ICD-10-CM | POA: Insufficient documentation

## 2023-05-23 LAB — CBC WITH DIFFERENTIAL/PLATELET
Abs Immature Granulocytes: 0.02 10*3/uL (ref 0.00–0.07)
Basophils Absolute: 0 10*3/uL (ref 0.0–0.1)
Basophils Relative: 1 %
Eosinophils Absolute: 0.3 10*3/uL (ref 0.0–0.5)
Eosinophils Relative: 5 %
HCT: 46.3 % (ref 39.0–52.0)
Hemoglobin: 15.4 g/dL (ref 13.0–17.0)
Immature Granulocytes: 0 %
Lymphocytes Relative: 35 %
Lymphs Abs: 1.9 10*3/uL (ref 0.7–4.0)
MCH: 28.1 pg (ref 26.0–34.0)
MCHC: 33.3 g/dL (ref 30.0–36.0)
MCV: 84.5 fL (ref 80.0–100.0)
Monocytes Absolute: 0.5 10*3/uL (ref 0.1–1.0)
Monocytes Relative: 9 %
Neutro Abs: 2.7 10*3/uL (ref 1.7–7.7)
Neutrophils Relative %: 50 %
Platelets: 247 10*3/uL (ref 150–400)
RBC: 5.48 MIL/uL (ref 4.22–5.81)
RDW: 12.6 % (ref 11.5–15.5)
WBC: 5.5 10*3/uL (ref 4.0–10.5)
nRBC: 0 % (ref 0.0–0.2)
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
## 2023-05-23 NOTE — Discharge Instructions (Addendum)
Your blood work was normal today.  Please wear the abdominal binder to help decrease the swelling in your abdomen.  This will improve on its own very slowly over time.  You can follow-up with your primary care provider regarding this as needed.  Take Tylenol and ibuprofen as needed for your pain.  You can return to the ED with any new or worsening symptoms.
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
